# Patient Record
Sex: Female | Born: 1965 | Race: White | Hispanic: No | Marital: Married | Smoking: Never smoker
Health system: Southern US, Community
[De-identification: ages and names within clinical notes are randomized; demographics above are authoritative.]

## PROBLEM LIST (undated history)

## (undated) DIAGNOSIS — E7800 Pure hypercholesterolemia, unspecified: Secondary | ICD-10-CM

## (undated) DIAGNOSIS — M7989 Other specified soft tissue disorders: Secondary | ICD-10-CM

## (undated) DIAGNOSIS — I10 Essential (primary) hypertension: Secondary | ICD-10-CM

## (undated) DIAGNOSIS — R0602 Shortness of breath: Secondary | ICD-10-CM

## (undated) DIAGNOSIS — IMO0002 Reserved for concepts with insufficient information to code with codable children: Secondary | ICD-10-CM

## (undated) DIAGNOSIS — O1490 Unspecified pre-eclampsia, unspecified trimester: Secondary | ICD-10-CM

## (undated) DIAGNOSIS — B951 Streptococcus, group B, as the cause of diseases classified elsewhere: Secondary | ICD-10-CM

## (undated) DIAGNOSIS — K08409 Partial loss of teeth, unspecified cause, unspecified class: Secondary | ICD-10-CM

## (undated) DIAGNOSIS — R7303 Prediabetes: Secondary | ICD-10-CM

## (undated) DIAGNOSIS — O321XX0 Maternal care for breech presentation, not applicable or unspecified: Secondary | ICD-10-CM

## (undated) DIAGNOSIS — G4730 Sleep apnea, unspecified: Secondary | ICD-10-CM

## (undated) DIAGNOSIS — N946 Dysmenorrhea, unspecified: Secondary | ICD-10-CM

## (undated) DIAGNOSIS — O98819 Other maternal infectious and parasitic diseases complicating pregnancy, unspecified trimester: Secondary | ICD-10-CM

## (undated) DIAGNOSIS — D219 Benign neoplasm of connective and other soft tissue, unspecified: Secondary | ICD-10-CM

## (undated) DIAGNOSIS — F32A Depression, unspecified: Secondary | ICD-10-CM

## (undated) DIAGNOSIS — R87619 Unspecified abnormal cytological findings in specimens from cervix uteri: Secondary | ICD-10-CM

## (undated) DIAGNOSIS — B019 Varicella without complication: Secondary | ICD-10-CM

## (undated) DIAGNOSIS — F329 Major depressive disorder, single episode, unspecified: Secondary | ICD-10-CM

## (undated) DIAGNOSIS — Z87442 Personal history of urinary calculi: Secondary | ICD-10-CM

## (undated) HISTORY — DX: Depression, unspecified: F32.A

## (undated) HISTORY — PX: BREAST ENHANCEMENT SURGERY: SHX7

## (undated) HISTORY — DX: Other maternal infectious and parasitic diseases complicating pregnancy, unspecified trimester: O98.819

## (undated) HISTORY — DX: Major depressive disorder, single episode, unspecified: F32.9

## (undated) HISTORY — DX: Other specified soft tissue disorders: M79.89

## (undated) HISTORY — DX: Prediabetes: R73.03

## (undated) HISTORY — PX: WISDOM TOOTH EXTRACTION: SHX21

## (undated) HISTORY — DX: Essential (primary) hypertension: I10

## (undated) HISTORY — DX: Streptococcus, group b, as the cause of diseases classified elsewhere: B95.1

## (undated) HISTORY — PX: HERNIA REPAIR: SHX51

## (undated) HISTORY — DX: Shortness of breath: R06.02

## (undated) HISTORY — DX: Partial loss of teeth, unspecified cause, unspecified class: K08.409

## (undated) HISTORY — DX: Benign neoplasm of connective and other soft tissue, unspecified: D21.9

## (undated) HISTORY — DX: Unspecified abnormal cytological findings in specimens from cervix uteri: R87.619

## (undated) HISTORY — DX: Dysmenorrhea, unspecified: N94.6

## (undated) HISTORY — DX: Sleep apnea, unspecified: G47.30

## (undated) HISTORY — PX: ABDOMINOPLASTY: SUR9

## (undated) HISTORY — PX: AUGMENTATION MAMMAPLASTY: SUR837

## (undated) HISTORY — DX: Varicella without complication: B01.9

## (undated) HISTORY — PX: OTHER SURGICAL HISTORY: SHX169

## (undated) HISTORY — PX: BREAST SURGERY: SHX581

## (undated) HISTORY — DX: Maternal care for breech presentation, not applicable or unspecified: O32.1XX0

## (undated) HISTORY — DX: Reserved for concepts with insufficient information to code with codable children: IMO0002

## (undated) HISTORY — DX: Unspecified pre-eclampsia, unspecified trimester: O14.90

## (undated) HISTORY — DX: Pure hypercholesterolemia, unspecified: E78.00

---

## 1997-10-17 ENCOUNTER — Inpatient Hospital Stay (HOSPITAL_COMMUNITY): Admission: AD | Admit: 1997-10-17 | Discharge: 1997-10-17 | Payer: Self-pay | Admitting: Obstetrics and Gynecology

## 1997-11-21 ENCOUNTER — Inpatient Hospital Stay (HOSPITAL_COMMUNITY): Admission: AD | Admit: 1997-11-21 | Discharge: 1997-11-26 | Payer: Self-pay | Admitting: Obstetrics and Gynecology

## 1997-11-26 ENCOUNTER — Encounter (HOSPITAL_COMMUNITY): Admission: RE | Admit: 1997-11-26 | Discharge: 1997-12-30 | Payer: Self-pay | Admitting: Obstetrics and Gynecology

## 1997-11-27 ENCOUNTER — Ambulatory Visit (HOSPITAL_COMMUNITY): Admission: RE | Admit: 1997-11-27 | Discharge: 1997-11-27 | Payer: Self-pay | Admitting: Obstetrics and Gynecology

## 1997-12-18 ENCOUNTER — Other Ambulatory Visit: Admission: RE | Admit: 1997-12-18 | Discharge: 1997-12-18 | Payer: Self-pay | Admitting: Obstetrics and Gynecology

## 1998-03-06 ENCOUNTER — Other Ambulatory Visit: Admission: RE | Admit: 1998-03-06 | Discharge: 1998-03-06 | Payer: Self-pay | Admitting: Obstetrics and Gynecology

## 1998-08-26 ENCOUNTER — Other Ambulatory Visit: Admission: RE | Admit: 1998-08-26 | Discharge: 1998-08-26 | Payer: Self-pay | Admitting: Obstetrics and Gynecology

## 1999-10-06 ENCOUNTER — Other Ambulatory Visit: Admission: RE | Admit: 1999-10-06 | Discharge: 1999-10-06 | Payer: Self-pay | Admitting: Obstetrics and Gynecology

## 2000-12-21 ENCOUNTER — Other Ambulatory Visit: Admission: RE | Admit: 2000-12-21 | Discharge: 2000-12-21 | Payer: Self-pay | Admitting: Obstetrics and Gynecology

## 2002-02-14 ENCOUNTER — Inpatient Hospital Stay (HOSPITAL_COMMUNITY): Admission: AD | Admit: 2002-02-14 | Discharge: 2002-02-14 | Payer: Self-pay | Admitting: Obstetrics and Gynecology

## 2002-02-21 ENCOUNTER — Inpatient Hospital Stay (HOSPITAL_COMMUNITY): Admission: AD | Admit: 2002-02-21 | Discharge: 2002-02-24 | Payer: Self-pay | Admitting: Obstetrics and Gynecology

## 2002-04-04 ENCOUNTER — Other Ambulatory Visit: Admission: RE | Admit: 2002-04-04 | Discharge: 2002-04-04 | Payer: Self-pay | Admitting: *Deleted

## 2003-08-12 ENCOUNTER — Other Ambulatory Visit: Admission: RE | Admit: 2003-08-12 | Discharge: 2003-08-12 | Payer: Self-pay | Admitting: Family Medicine

## 2004-08-13 ENCOUNTER — Other Ambulatory Visit: Admission: RE | Admit: 2004-08-13 | Discharge: 2004-08-13 | Payer: Self-pay | Admitting: Family Medicine

## 2005-03-10 ENCOUNTER — Encounter: Admission: RE | Admit: 2005-03-10 | Discharge: 2005-03-10 | Payer: Self-pay | Admitting: Family Medicine

## 2006-03-29 ENCOUNTER — Encounter: Admission: RE | Admit: 2006-03-29 | Discharge: 2006-03-29 | Payer: Self-pay | Admitting: Family Medicine

## 2006-05-04 ENCOUNTER — Encounter: Admission: RE | Admit: 2006-05-04 | Discharge: 2006-05-04 | Payer: Self-pay | Admitting: Family Medicine

## 2007-03-28 ENCOUNTER — Encounter: Admission: RE | Admit: 2007-03-28 | Discharge: 2007-03-28 | Payer: Self-pay | Admitting: General Surgery

## 2008-01-23 ENCOUNTER — Encounter: Admission: RE | Admit: 2008-01-23 | Discharge: 2008-01-23 | Payer: Self-pay | Admitting: Family Medicine

## 2008-05-16 ENCOUNTER — Encounter: Admission: RE | Admit: 2008-05-16 | Discharge: 2008-05-16 | Payer: Self-pay | Admitting: Family Medicine

## 2009-03-02 ENCOUNTER — Encounter: Admission: RE | Admit: 2009-03-02 | Discharge: 2009-03-02 | Payer: Self-pay | Admitting: Family Medicine

## 2010-03-28 ENCOUNTER — Emergency Department (HOSPITAL_BASED_OUTPATIENT_CLINIC_OR_DEPARTMENT_OTHER)
Admission: EM | Admit: 2010-03-28 | Discharge: 2010-03-28 | Payer: Self-pay | Source: Home / Self Care | Admitting: Emergency Medicine

## 2010-04-05 LAB — BASIC METABOLIC PANEL
BUN: 17 mg/dL (ref 6–23)
CO2: 22 mEq/L (ref 19–32)
Calcium: 8.8 mg/dL (ref 8.4–10.5)
Chloride: 103 mEq/L (ref 96–112)
Creatinine, Ser: 0.7 mg/dL (ref 0.4–1.2)
GFR calc Af Amer: >60 mL/min (ref >60)
GFR calc non Af Amer: >60 mL/min (ref >60)
Glucose, Bld: 110 mg/dL — ABNORMAL HIGH (ref 70–99)
Potassium: 3.4 mEq/L — ABNORMAL LOW (ref 3.5–5.1)
Sodium: 140 mEq/L (ref 135–145)

## 2010-04-05 LAB — CBC
HCT: 40 % (ref 36.0–46.0)
Hemoglobin: 14.1 g/dL (ref 12.0–15.0)
MCH: 30.1 pg (ref 26.0–34.0)
MCHC: 35.3 g/dL (ref 30.0–36.0)
MCV: 85.3 fL (ref 78.0–100.0)
Platelets: 237 10*3/uL (ref 150–400)
RBC: 4.69 MIL/uL (ref 3.87–5.11)
RDW: 13.4 % (ref 11.5–15.5)
WBC: 9.1 10*3/uL (ref 4.0–10.5)

## 2010-04-05 LAB — URINALYSIS, ROUTINE W REFLEX MICROSCOPIC
Hgb urine dipstick: NEGATIVE
Ketones, ur: NEGATIVE mg/dL
Leukocytes, UA: NEGATIVE
Nitrite: NEGATIVE
Protein, ur: 30 mg/dL — AB
Specific Gravity, Urine: 1.03 (ref 1.005–1.030)
Urine Glucose, Fasting: NEGATIVE mg/dL
Urobilinogen, UA: 0.2 mg/dL (ref 0.0–1.0)
pH: 6 (ref 5.0–8.0)

## 2010-04-05 LAB — URINE MICROSCOPIC-ADD ON

## 2010-04-05 LAB — URINE CULTURE
Colony Count: NO GROWTH
Culture  Setup Time: 201201082210
Culture: NO GROWTH

## 2010-04-11 ENCOUNTER — Encounter: Payer: Self-pay | Admitting: Family Medicine

## 2010-04-22 ENCOUNTER — Other Ambulatory Visit: Payer: Self-pay | Admitting: Family Medicine

## 2010-04-22 DIAGNOSIS — Z1239 Encounter for other screening for malignant neoplasm of breast: Secondary | ICD-10-CM

## 2010-05-04 ENCOUNTER — Ambulatory Visit: Payer: Self-pay

## 2010-05-06 ENCOUNTER — Ambulatory Visit: Payer: Self-pay

## 2010-05-20 ENCOUNTER — Ambulatory Visit
Admission: RE | Admit: 2010-05-20 | Discharge: 2010-05-20 | Disposition: A | Discharge status: Home / Self Care | Payer: Commercial Indemnity | Source: Ambulatory Visit | Attending: Family Medicine | Admitting: Family Medicine

## 2010-05-20 DIAGNOSIS — Z1239 Encounter for other screening for malignant neoplasm of breast: Secondary | ICD-10-CM

## 2010-06-16 ENCOUNTER — Other Ambulatory Visit (HOSPITAL_COMMUNITY)
Admission: RE | Admit: 2010-06-16 | Discharge: 2010-06-16 | Disposition: A | Discharge status: Home / Self Care | Payer: Commercial Indemnity | Source: Ambulatory Visit | Attending: Family Medicine | Admitting: Family Medicine

## 2010-06-16 ENCOUNTER — Other Ambulatory Visit: Payer: Self-pay | Admitting: Family Medicine

## 2010-06-16 DIAGNOSIS — Z124 Encounter for screening for malignant neoplasm of cervix: Secondary | ICD-10-CM | POA: Insufficient documentation

## 2010-06-16 DIAGNOSIS — Z1159 Encounter for screening for other viral diseases: Secondary | ICD-10-CM | POA: Insufficient documentation

## 2010-08-06 NOTE — H&P (Signed)
NAME:  Judy Weaver, ADMIRE NO.:  [id number]   MEDICAL RECORD NO.:  [id number]                   PATIENT TYPE:  INP   LOCATION:  NA                                   FACILITY[facility]  [facility]   PHYSICIAN:  Janine Limbo, M.D.            DATE OF BIRTH:  08/23/1965   DATE OF ADMISSION:  02/21/2002  DATE OF DISCHARGE:                                HISTORY & PHYSICAL   HISTORY OF PRESENT ILLNESS:  The patient is a 45 year old female gravida 3,  para 1-0-1-1 who presents at [redacted] weeks gestation ([facility] March 03, 2002) for  repeat cesarean section.  The patient has been followed at [facility] and Gynecology for this pregnancy that has been largely  uncomplicated.  She does have a prior history of a cesarean section because  of a breech infant.  She also has a prior history of a large for gestational  age infant and a history of preeclampsia.  She had beta Strep during her  last pregnancy.  She also had some preterm labor symptoms with her last  pregnancy, but did deliver at term.  That infant weighed 9 pounds and 7  ounces at 38 weeks.   OBSTETRICAL HISTORY:  The patient had a cesarean section in 1999 where she  delivered a 9 pound 7 ounce female infant at term (breech presentation).  That  pregnancy was complicated by preeclampsia.  In 1987 she had an elective  pregnancy termination.   ALLERGIES:  SULFA MEDICATIONS cause nausea and vomiting.   PAST MEDICAL HISTORY:  The patient denies hypertension and diabetes other  than as mentioned above.  She did have her wisdom teeth removed at age 59.  She had a tonsillectomy at age 35.   REVIEW OF SYSTEMS:  Normal pregnancy complaints.   SOCIAL HISTORY:  The patient denies cigarette use, alcohol use, and  recreational drug use.   FAMILY HISTORY:  The patient's father has chronic hypertension.   PHYSICAL EXAMINATION:  VITAL SIGNS:  Weight 181 pounds.  HEENT:  Within normal limits.  CHEST:  Clear.  HEART:  Regular rate and rhythm.  BREASTS:  Without masses.  ABDOMEN:  Gravid with a fundal height of 37 cm.  EXTREMITIES:  Within normal limits.  NEUROLOGIC:  Grossly normal.  PELVIC:  Cervix is closed and long.   LABORATORY VALUES:  Blood type is O+.  Antibody screen negative.  Toxoplasmosis screen negative.  VDRL nonreactive.  Rubella immune.  HBSAG  negative.  HIV nonreactive.  GC negative.  Chlamydia negative.  Alpha  fetoprotein screen within normal limits.  Three hour GTT was within normal  limits.   ASSESSMENT:  1. A [redacted] week gestation.  2. Prior cesarean section and desires repeat cesarean section.  3. History of postpartum depression.   PLAN:  The patient will undergo a repeat low transverse cesarean section.  She does not want her tubes  tied at this time.  We will continue the patient  on Zoloft 50 mg q.d.                                               Janine Limbo, M.D.    AVS/MEDQ  D:  01/31/2002  T:  01/31/2002  Job:  [id number]

## 2010-08-06 NOTE — Op Note (Signed)
NAME:  Judy Weaver, Judy Weaver NO.:  [id number]   MEDICAL RECORD NO.:  [id number]                   PATIENT TYPE:  INP   LOCATION:  9199                                 [facility]ILITY[facility]  [facility]   PHYSICIAN:  Janine Limbo, M.D.            DATE OF BIRTH:  01-15-1966   DATE OF PROCEDURE:  02/21/2002  DATE OF DISCHARGE:                                 OPERATIVE REPORT   PREOPERATIVE DIAGNOSES:  1. Term intrauterine pregnancy.  2. Prior cesarean section.  3. Desires repeat cesarean section.   POSTOPERATIVE DIAGNOSES:  1. Term intrauterine pregnancy.  2. Prior cesarean section.  3. Desires repeat cesarean section.   PROCEDURE:  Repeat low transverse cesarean section.   SURGEON:  Janine Limbo, M.D.   FIRST ASSISTANT:  Renaldo Reel. Emilee Hero, C.N.M.   ANESTHESIA:  Spinal.   DISPOSITION:  The patient is a 45 year old female gravida 3, para 1-0-1-1  who presents at [redacted] weeks gestation for repeat cesarean section.  She  understands the indications for her procedure and she accepts the risks of,  but not limited to, anesthetic complications, bleeding, infections, and  possible damage to the surrounding organs.   FINDINGS:  A 9 pound female infant English as a second language teacher) was delivered from a cephalic  presentation.  The Apgars were 8 at one minute and 9 at five minutes.  The  uterus, fallopian tubes, and ovaries were normal for the gravid state.   PROCEDURE:  The patient was taken to the operating room where a spinal  anesthetic was given.  The patient's abdomen, perineum, and outer vagina  were prepped with multiple layers of Betadine.  A Foley catheter was placed  in the bladder.  The patient was sterilely draped.  A low transverse  incision was made in the abdomen and carried sharply through the  subcutaneous tissue, the fascia, and the anterior peritoneum.  An incision  was made in the lower uterine segment and the bladder flap was developed.  The fetal head was delivered  without difficulty.  The mouth and nose were  suctioned.  The remainder of the infant was delivered.  A nuchal cord was  reduced.  The cord was clamped and cut and the infant was handed to the  waiting pediatric team.  Routine cord blood studies were obtained.  The  placenta was removed.  The uterine cavity was cleaned of amniotic fluid,  clotted blood, and membranes.  The uterine incision was closed using a  running locking suture of 2-0 Vicryl followed by an embrocating suture of 2-  0 Vicryl.  Figure-of-eight sutures of 2-0 Vicryl and 0 Vicryl were used for  hemostasis.  The pelvis was irrigated.  Hemostasis was adequate.  The  anterior peritoneum and the abdominal musculature were reapproximated in the  midline using 0 Vicryl.  The fascia was closed using a running suture of 0  Vicryl followed by three interrupted sutures of  0 Vicryl.  A subcutaneous  layer was closed using 0 Vicryl.  The skin was reapproximated using a  subcuticular suture of 3-0 Monocryl.  Sponge, needle, and instrument counts  were correct on two occasions.  The estimated blood loss for the procedure  was 700 cc.  The patient tolerated her procedure well.  She was taken to the  recovery room in stable condition.  The infant was taken to the full-term  nursery in stable condition.                                               Janine Limbo, M.D.    AVS/MEDQ  D:  02/21/2002  T:  02/21/2002  Job:  [id number]

## 2010-08-06 NOTE — H&P (Signed)
NAME:  Judy Weaver, Judy Weaver NO.:  [id number]   MEDICAL RECORD NO.:  [id number]                   PATIENT TYPE:  INP   LOCATION:  NA                                   [facility]   PHYSICIAN:  Janine Limbo, M.D.            DATE OF BIRTH:  [birth year]   DATE OF ADMISSION:  02/21/2002  DATE OF DISCHARGE:                                HISTORY & PHYSICAL   HISTORY OF PRESENT ILLNESS:  The patient is a 45 year old female, gravida 3,  para 1-0-1-1, who presents at [redacted] weeks gestation ([facility] is 03/03/02) for  repeat cesarean section.  She has been followed at [facility] and Gynecology for this pregnancy that has been complicated by  the fact that she had a prior cesarean section.  In addition, she has a  history of a prior positive beta strep culture.  She has had pre-term labor  in the past, but then a term delivery.  She also had preeclampsia with her  prior pregnancy, and she had a large for gestational age infant (weight was  9 pounds 7 ounces).  She currently is taking Zoloft because of her history  of postpartum depression, and she feels much better on this medication.   PAST OBSTETRICAL HISTORY:  1. In 1987, the patient had a first trimester elective pregnancy     termination.  2. In 1999, she had a cesarean section at term where she delivered a 9 pound     7 ounce female infant.  That infant was in a breech position, but she also     had pregnancy induced hypertension.   PAST MEDICAL HISTORY:  Please see history of present illness.   ALLERGIES:  SULFA MEDICATIONS cause vomiting.   SOCIAL HISTORY:  The patient denies cigarette use, alcohol use, and  recreational drug use.   REVIEW OF SYMPTOMS:  Normal pregnancy complaints.   FAMILY HISTORY:  Noncontributory.   PHYSICAL EXAMINATION:  VITAL SIGNS:  Weight is 183 pounds.  HEENT:  Within normal limits.  CHEST:  Clear.  HEART:  Regular rate and rhythm.  BREASTS:  Without  masses.  ABDOMEN:  Gravid with a fundal height of 39 cm.  EXTREMITIES:  Within normal limits.  NEUROLOGIC:  Grossly normal.   LABORATORY DATA:  Blood type is O positive, antibody screen negative, VDRL  nonreactive.  Toxoplasmosis negative.  Rubella immune.  HBSAG negative.  HIV  nonreactive.  Pap within normal limits.  GC negative.  Chlamydia negative.  Cystic fibrosis negative.  Alpha fetoprotein within normal limits.  Third  trimester beta strep negative.  Three hour GTT within normal limits.  Third  trimester GC and Chlamydia were both negative.   ASSESSMENT:  1. Gestation at 38 weeks.  2. Prior cesarean section.  3. Desires repeat cesarean section.  4. History of postpartum depression.   PLAN:  The patient will undergo a repeat  low transverse cesarean section.  She understands the indications for her procedure, and she accepts the risks  of, but not limited to, anesthetic complications, bleeding, infection, and  possible damage to the surrounding organs.  She does not desire tubal  ligation.                                                Janine Limbo, M.D.    AVS/MEDQ  D:  02/17/2002  T:  02/17/2002  Job:  [id number]

## 2010-08-06 NOTE — Discharge Summary (Signed)
NAME:  Judy Weaver, Judy Weaver NO.:  [id number]   MEDICAL RECORD NO.:  [id number]                   PATIENT TYPE:  INP   LOCATION:  9136                                 [facility]   PHYSICIAN:  Crist Fat. Rivard, M.D.              DATE OF BIRTH:  Sep 13, 1965   DATE OF ADMISSION:  02/21/2002  DATE OF DISCHARGE:  02/24/2002                                 DISCHARGE SUMMARY   ADMISSION DIAGNOSES:  1. Intrauterine pregnancy at 38 weeks.  2. Previous Cesarean section, desires repeat.  3. History of postpartum depression.   DISCHARGE DIAGNOSES:  1. Intrauterine pregnancy at 38 weeks.  2. Previous Cesarean section, desires repeat.  3. History of postpartum depression.  4. Anemia.   PROCEDURE:  1. Repeat low transverse Cesarean section.  2. Spinal anesthesia.   HISTORY OF PRESENT ILLNESS:  Judy Weaver is a 45 year old Gravida III, Para  I, 0/1/1 at 38 weeks who was admitted on February 21, 2002 for repeat  Cesarean section. Her pregnancy had been remarkable for the following:   GYNECOLOGIC HISTORY:  1. History of previous Cesarean section secondary to preeclampsia and breech     presentation.  2. History of preeclampsia.  3. History of preterm labor with a term delivery.  4. History of LGA infant.  5. History of postpartum depression.   HOSPITAL COURSE:  The patient was taken to the OR where a repeat low  transverse Cesarean section was performed by Dr. Marline Backbone under  spinal anesthesia. Findings were a viable female by the name of Judy Weaver.  Weight was 9 pounds. Apgar's were 8 and 9. The infant was taken to the full  term nursery. Mother tolerated the procedure well. She was taken to the  recovery room  in good condition. Social work consult was accomplished for  history of postpartum depression but the patient reported that she had good  support services and she was already on Zoloft prophylactically. By  postpartum day one, she was doing well.  She was up ad lib. Her dressing was  clean, dry, and intact. She was bottle feeding. She is undecided regarding  contraception. She was having some gastric distention but did have bowel  sounds. Her hemoglobin was 8.9, down from 8.8. WBC count 7.9. Platelets were  198. She declined transfusion. She was tolerating ambulation without  difficulty. She also was on Aldomet for hypertension during the third  trimester of this pregnancy. This was discontinued on postoperative day two  secondary to normalized blood pressures. The rest of her hospital course was  uncomplicated. By day three, she was up ad lib. Her incision was clean, dry,  and intact with subcutaneous sutures noted. The patient was considering oral  contraceptives but had not fully decided. Her blood pressure was within  normal limits.   DISPOSITION:  The patient was deemed to have received full benefit from her  hospital  stay and was discharged to home.   DISCHARGE INSTRUCTIONS:  She was presented with [facility]out.   MEDICATIONS:  1. Motrin 600 mg by mouth every six hours as needed pain.  2. Tylox 1-2 by mouth every three to four hour as needed pain.   FOLLOW UP:  In two weeks for blood pressure check and another discussion  regarding contraception since the patient may be interested in starting  birth control pills, prior to her six week visit. Follow-up will also occur  at six weeks postpartum.   DICTATED BY:  Renaldo Reel. Emilee Hero, C.N.M.                                                Crist Fat Rivard, M.D.    SAR/MEDQ  D:  02/24/2002  T:  02/25/2002  Job:  [id number]   cc:   Dois Davenport A. Rivard, M.D.  [address], [address]  [place]  [place] [address]  Fax: [phone]

## 2011-05-09 ENCOUNTER — Other Ambulatory Visit: Payer: Self-pay | Admitting: Family Medicine

## 2011-05-09 DIAGNOSIS — Z1231 Encounter for screening mammogram for malignant neoplasm of breast: Secondary | ICD-10-CM

## 2011-06-08 ENCOUNTER — Ambulatory Visit: Payer: Commercial Indemnity

## 2011-06-15 ENCOUNTER — Ambulatory Visit
Admission: RE | Admit: 2011-06-15 | Discharge: 2011-06-15 | Disposition: A | Discharge status: Home / Self Care | Payer: 59 | Source: Ambulatory Visit | Attending: Family Medicine | Admitting: Family Medicine

## 2011-06-15 DIAGNOSIS — Z1231 Encounter for screening mammogram for malignant neoplasm of breast: Secondary | ICD-10-CM

## 2011-06-20 ENCOUNTER — Other Ambulatory Visit: Payer: Self-pay | Admitting: Family Medicine

## 2011-06-20 DIAGNOSIS — R928 Other abnormal and inconclusive findings on diagnostic imaging of breast: Secondary | ICD-10-CM

## 2011-06-23 ENCOUNTER — Ambulatory Visit
Admission: RE | Admit: 2011-06-23 | Discharge: 2011-06-23 | Disposition: A | Discharge status: Home / Self Care | Payer: 59 | Source: Ambulatory Visit | Attending: Family Medicine | Admitting: Family Medicine

## 2011-06-23 DIAGNOSIS — R928 Other abnormal and inconclusive findings on diagnostic imaging of breast: Secondary | ICD-10-CM

## 2011-07-27 ENCOUNTER — Telehealth: Payer: Self-pay | Admitting: Obstetrics and Gynecology

## 2011-08-05 DIAGNOSIS — D219 Benign neoplasm of connective and other soft tissue, unspecified: Secondary | ICD-10-CM

## 2011-08-09 ENCOUNTER — Ambulatory Visit (INDEPENDENT_AMBULATORY_CARE_PROVIDER_SITE_OTHER): Payer: 59 | Admitting: Obstetrics and Gynecology

## 2011-08-09 ENCOUNTER — Encounter: Payer: Self-pay | Admitting: Obstetrics and Gynecology

## 2011-08-09 VITALS — BP 102/70 | Resp 16 | Ht 64.0 in | Wt 164.0 lb

## 2011-08-09 DIAGNOSIS — Z30430 Encounter for insertion of intrauterine contraceptive device: Secondary | ICD-10-CM

## 2011-08-09 DIAGNOSIS — IMO0001 Reserved for inherently not codable concepts without codable children: Secondary | ICD-10-CM

## 2011-08-09 DIAGNOSIS — Z309 Encounter for contraceptive management, unspecified: Secondary | ICD-10-CM

## 2011-08-09 LAB — POCT URINE PREGNANCY: Preg Test, Ur: NEGATIVE

## 2011-08-09 MED ORDER — LEVONORGESTREL 20 MCG/24HR IU IUD: INTRAUTERINE_SYSTEM | Freq: Once | INTRAUTERINE | Status: DC

## 2011-08-09 NOTE — Progress Notes (Signed)
Patient ID: Judy Weaver, female   DOB: [birth year], 46 y.o.   MRN: [id number] IUD: Mirena LOT#: [id number] Exp: 06/15 UPT: negative GC/CHLAMYDIA: sent CONSENT SIGNED: yes DISINFECTION WITH betadine X3 UTERUS SOUNDED AT 7 CM IUD INSERTED PER PROTOCOL: yes COMPLICATION: none PATIENT INSTRUCTED TO CALL IS FEVER OR ABNORMAL PAIN:no PATIENT INSTRUCTED ON HOW TO CHECK IUD STRINGS: yes FOLLOW UP APPT: 6 weeks  The patient is a 46 year old who presents with a Mirena IUD that she has had for 5 years.  She does not plan to have more children.  We discussed her contraceptive options including vasectomy.  The patient reports that she does not have a regular cycle and she likes this.  She wants to maintain intrauterine contraception.  The patient reports that she sees her family doctor for her routine care.  She is up-to-date with her Pap smears.  She recently had a mammogram followed by an ultrasound.  No pathology was appreciated.  Abdomen is soft and nontender  Pelvic exam:  External genitalia: Normal  Vagina: Relaxation noted  Cervix: Nontender, no lesions, IUD string visualized  Uterus: Normal size shape and consistency  Adnexa: No masses  Pregnancy test: Negative  Procedure:  The vagina and cervix were prepped with Betadine.  IUD was removed without difficulty.  A new Mirena IUD was placed per protocol.  See above.  Plan:  Instructions given.  The patient will return 6 weeks for followup examination.  She will call for questions or concerns. GC and Chlamydia sent.  Dr. Stefano Gaul

## 2011-08-09 NOTE — Patient Instructions (Signed)
Intrauterine Device Insertion Most often, an intrauterine device (IUD) is inserted into the uterus to prevent pregnancy. There are 2 types of IUDs available:  Copper IUD. This type of IUD creates an environment that is not favorable to sperm survival. The mechanism of action of the copper IUD is not known for certain. It can stay in place for 10 years.   Hormone IUD. This type of IUD contains the hormone progestin (synthetic progesterone). The progestin thickens the cervical mucus and prevents sperm from entering the uterus, and it also thins the uterine lining. There is no evidence that the hormone IUD prevents implantation. The hormone IUD can stay in place for up to 5 years.  An IUD is the most cost-effective birth control if left in place for the full duration. It may be removed at any time. LET YOUR CAREGIVER KNOW ABOUT:  Sensitivity to metals.   Medicines taken including herbs, eyedrops, over-the-counter medicines, and creams.   Use of steroids (by mouth or creams).   Previous problems with anesthetics or numbing medicine.   Previous gynecological surgery.   History of blood clots or clotting disorders.   Possibility of pregnancy.   Menstrual irregularities.   Concerns regarding unusual vaginal discharge or odors.   Previous experience with an IUD.   Other health problems.  RISKS AND COMPLICATIONS  Accidental puncture (perforation) of the uterus.   Accidental placement of the IUD either in the muscle layer of the uterus (myometrium) or outside the uterus. If this happen, the IUD can be found essentially floating around the bowels. When this happens, the IUD must be taken out surgically.   The IUD may fall out of the uterus (expulsion). This is more common in women who have recently had a child.    Pregnancy in the fallopian tube (ectopic).  BEFORE THE PROCEDURE  Schedule the IUD insertion for when you will have your menstrual period or right after, to make sure you  are not pregnant. Placement of the IUD is better tolerated shortly after a menstrual cycle.   You may need to take tests or be examined to make sure you are not pregnant.   You may be required to take a pregnancy test.   You may be required to get checked for sexually transmitted infections (STIs) prior to placement. Placing an IUD in someone who has an infection can make an infection worse.   You may be given a pain reliever to take 1 or 2 hours before the procedure.   An exam will be performed to determine the size and position of your uterus.   Ask your caregiver about changing or stopping your regular medicines.  PROCEDURE   A tool (speculum) is placed in the vagina. This allows your caregiver to see the lower part of the uterus (cervix).   The cervix is prepped with a medicine that lowers the risk of infection.   You may be given a medicine to numb each side of the cervix (intracervical or paracervical block). This is used to block and control any discomfort with insertion.   A tool (uterine sound) is inserted into the uterus to determine the length of the uterine cavity and the direction the uterus may be tilted.   A slim instrument (IUD inserter) is inserted through the cervical canal and into your uterus.   The IUD is placed in the uterine cavity and the insertion device is removed.   The nylon string that is attached to the IUD, and used for   eventual IUD removal, is trimmed. It is trimmed so that it lays high in the vagina, just outside the cervix.  AFTER THE PROCEDURE  You may have bleeding after the procedure. This is normal. It varies from light spotting for a few days to menstrual-like bleeding.   You may have mild cramping.   Practice checking the string coming out of the cervix to make sure the IUD remains in the uterus. If you cannot feel the string, you should schedule a "string check" with your caregiver.   If you had a hormone IUD inserted, expect that your  period may be lighter or nonexistent within a year's time (though this is not always the case). There may be delayed fertility with the hormone IUD as a result of its progesterone effect. When you are ready to become pregnant, it is suggested to have the IUD removed up to 1 year in advance.   Yearly exams are advised.  Document Released: 11/03/2010 Document Revised: 02/24/2011 Document Reviewed: 11/03/2010 [employer] Patient Information 2012 [employer]. 

## 2011-08-10 LAB — GC/CHLAMYDIA PROBE AMP, GENITAL
Chlamydia, DNA Probe: NEGATIVE
GC Probe Amp, Genital: NEGATIVE

## 2011-09-19 ENCOUNTER — Encounter: Payer: 59 | Admitting: Obstetrics and Gynecology

## 2011-09-29 ENCOUNTER — Ambulatory Visit (INDEPENDENT_AMBULATORY_CARE_PROVIDER_SITE_OTHER): Payer: 59 | Admitting: Obstetrics and Gynecology

## 2011-09-29 ENCOUNTER — Encounter: Payer: Self-pay | Admitting: Obstetrics and Gynecology

## 2011-09-29 VITALS — BP 90/62 | Ht 62.4 in | Wt 166.0 lb

## 2011-09-29 DIAGNOSIS — E663 Overweight: Secondary | ICD-10-CM | POA: Insufficient documentation

## 2011-09-29 DIAGNOSIS — N951 Menopausal and female climacteric states: Secondary | ICD-10-CM | POA: Insufficient documentation

## 2011-09-29 DIAGNOSIS — Z309 Encounter for contraceptive management, unspecified: Secondary | ICD-10-CM

## 2011-09-29 NOTE — Progress Notes (Signed)
HISTORY OF PRESENT ILLNESS  [name] is a 46 y.o. year old female,G3P2, who presents for a problem visit. The patient had a Mirena IUD placed on Aug 09, 2011.  GC negative. Chlamydia negative.  Subjective:  The patient reports that she is doing well with her Mirena.  There are no complaints with the length of the string.  The patient complains of night sweats, decreased libido, increase emotions, and vaginal dryness.  Objective:  BP 90/62  Ht 5' 2.4" (1.585 m)  Wt 166 lb (75.297 kg)  BMI 29.97 kg/m2   General: alert and no distress Resp: clear to auscultation bilaterally Cardio: regular rate and rhythm, S1, S2 normal, no murmur, click, rub or gallop GI: soft, non-tender; bowel sounds normal; no masses,  no organomegaly  External genitalia: normal general appearance Vaginal: normal without tenderness, induration or masses and relaxation noted Cervix: normal appearance and IUD string visualized Adnexa: normal bimanual exam Uterus: upper limits normal size shape and consistency  Assessment:  Menopausal symptoms Normal functioning Mirena IUD  Plan:  Menopausal symptoms were discussed. The women's health initiative was discussed. Treatment options reviewed.  The patient was given a handout about different herbal medications.  She will call or return if her symptoms increased to the point she feels that additional medication is required.  Return to office prn if symptoms worsen or fail to improve. This time for the patient to schedule her GYN annual exam.   Leonard Schwartz M.D.  09/29/2011 11:35 AM

## 2011-10-13 ENCOUNTER — Telehealth: Payer: Self-pay | Admitting: Obstetrics and Gynecology

## 2011-10-13 NOTE — Telephone Encounter (Signed)
TC from pt.   States had irreg spotting before previous Mirena was removed.  Rceived new Mirena 07/2011.  Has been spotting daily since 09/29/11 enough to wear panty liner. Per DR AVS may be readjustment to new Mirena.  May observe or make appt to discuss possible use of Estrogen.  Pt opts to observe and call if continues or increases.

## 2011-10-13 NOTE — Telephone Encounter (Signed)
-----   Message from Jerilynn Mages sent at 10/13/2011 1:57 PM ----- Pt calling has questions about spotting and her IUD.

## 2011-10-13 NOTE — Telephone Encounter (Signed)
TC to pt. LM to return call. Regarding message. 

## 2011-11-16 ENCOUNTER — Encounter: Payer: 59 | Admitting: Obstetrics and Gynecology

## 2012-03-06 ENCOUNTER — Encounter: Payer: Self-pay | Admitting: Obstetrics and Gynecology

## 2012-03-06 ENCOUNTER — Ambulatory Visit (INDEPENDENT_AMBULATORY_CARE_PROVIDER_SITE_OTHER): Payer: Managed Care, Other (non HMO) | Admitting: Obstetrics and Gynecology

## 2012-03-06 VITALS — BP 98/66 | Ht 62.0 in | Wt 168.0 lb

## 2012-03-06 DIAGNOSIS — N951 Menopausal and female climacteric states: Secondary | ICD-10-CM

## 2012-03-06 DIAGNOSIS — N926 Irregular menstruation, unspecified: Secondary | ICD-10-CM

## 2012-03-06 MED ORDER — ESTRADIOL 0.5 MG PO TABS: 0.5000 mg | ORAL_TABLET | Freq: Every day | ORAL | Status: DC

## 2012-03-06 NOTE — Progress Notes (Signed)
HISTORY OF PRESENT ILLNESS  Judy Weaver is a 46 y.o. year old female,G3P2, who presents for a problem visit. The patient had a Mirena IUD placed in May 2013. The patient has a known history of fibroids.  Subjective:  The patient complains of continued aggravating bleeding.  She has also noticed that she has an increase in her menopausal symptoms.  Objective:  BP 98/66  Ht 5\' 2"  (1.575 m)  Wt 168 lb (76.204 kg)  BMI 30.73 kg/m2   General: no distress Resp: clear to auscultation bilaterally Cardio: regular rate and rhythm, S1, S2 normal, no murmur, click, rub or gallop GI: soft and nontender  External genitalia: normal general appearance Vaginal: normal without tenderness, induration or masses Cervix: normal appearance, IUD string visualized and menstrual blood Adnexa: normal bimanual exam Uterus: upper limits normal size  Assessment:  Irregular bleeding  Menopausal symptoms  Fibroid uterus  Plan:  Irregular bleeding associated with Mirena IUD was discussed.  She is disappointed because she has had an IUD in the past and did not have this.  Treatment options were reviewed.  Began estradiol 0.5 mg daily.  Risk and benefits discussed.  Hormone replacement therapy and menopause management reviewed.  Questions answered.  Women's health initiative outlined.  The patient was to began therapy for the moment.  Return to office in 2 month(s).   Leonard Schwartz M.D.  03/06/2012 8:16 PM    When did bleeding start: spotting every day since Mirena was put in   How  Long: since May 2013 Had breakthrough bleeding the month of November and still has it  How often changing pad/tampon: wears pantyliner  Bleeding Disorders: no Cramping: yes Contraception: yes Fibroids: no Hormone Therapy: no New Medications: no Menopausal Symptoms: yes Vag. Discharge: brown -red spotting  Abdominal Pain: no Increased Stress: yes

## 2012-05-08 ENCOUNTER — Ambulatory Visit: Payer: Managed Care, Other (non HMO) | Admitting: Obstetrics and Gynecology

## 2012-05-08 ENCOUNTER — Encounter: Payer: Self-pay | Admitting: Obstetrics and Gynecology

## 2012-05-08 VITALS — BP 98/54 | Ht 62.0 in | Wt 168.0 lb

## 2012-05-08 DIAGNOSIS — D259 Leiomyoma of uterus, unspecified: Secondary | ICD-10-CM

## 2012-05-08 DIAGNOSIS — N951 Menopausal and female climacteric states: Secondary | ICD-10-CM

## 2012-05-08 DIAGNOSIS — N926 Irregular menstruation, unspecified: Secondary | ICD-10-CM

## 2012-05-08 MED ORDER — ESTRADIOL 1 MG PO TABS: 1.0000 mg | ORAL_TABLET | Freq: Every day | ORAL | Status: DC

## 2012-05-08 NOTE — Progress Notes (Signed)
HISTORY OF PRESENT ILLNESS  [name] is a 47 y.o. year old female,G3P2, who presents for a problem visit. The patient has a known history of fibroids.  She had a Mirena IUD placed in May 2013. She was started on estradiol tablets 0.5 mg daily in December 2013.  Subjective:  The patient reports that her irregular bleeding is better.  Her menopausal symptoms are also better.  She continued to have both to some degree.  Objective:  BP 98/54  Ht 5\' 2"  (1.575 m)  Wt 168 lb (76.204 kg)  BMI 30.72 kg/m2   General: no distress GI: soft and nontender  Exam deferred.  Assessment:  Irregular bleeding  Fibroid uterus  Menopausal symptoms    Plan:  The natural history of uterine fibroids was discussed. The treatment options were reviewed including observation only, nonsteroidal anti-inflammatory agents, progestin therapy ( pills, Depo-Provera, Mirena IUD), combination hormonal therapy ( birth control pills, birth control ring, contraceptive patch), Lupron therapy, Lysteda, endometrial ablation, hysteroscopy with resection and dilatation and curettage, uterine fibroid embolization, high-frequency ultrasound ablation, myomectomy, and hysterectomy.  The risks and benefits of each of the options were discussed.  The patient's questions were answered.  Management of menopausal symptoms discussed.  Begin estradiol 1.0 mg daily.  Estrogen replacement therapy reviewed.  The patient agrees.  Return to office prn if symptoms worsen or fail to improve.   Leonard Schwartz M.D.  05/08/2012 11:02 AM

## 2012-07-12 ENCOUNTER — Other Ambulatory Visit: Payer: Self-pay

## 2012-07-12 DIAGNOSIS — Z1231 Encounter for screening mammogram for malignant neoplasm of breast: Secondary | ICD-10-CM

## 2012-08-09 ENCOUNTER — Ambulatory Visit
Admission: RE | Admit: 2012-08-09 | Discharge: 2012-08-09 | Disposition: A | Discharge status: Home / Self Care | Payer: Commercial Indemnity | Source: Ambulatory Visit

## 2012-08-09 DIAGNOSIS — Z1231 Encounter for screening mammogram for malignant neoplasm of breast: Secondary | ICD-10-CM

## 2012-08-30 ENCOUNTER — Other Ambulatory Visit: Payer: Self-pay | Admitting: Dermatology

## 2013-05-20 ENCOUNTER — Telehealth (INDEPENDENT_AMBULATORY_CARE_PROVIDER_SITE_OTHER): Payer: Self-pay | Admitting: General Surgery

## 2013-05-20 NOTE — Telephone Encounter (Signed)
Pt called to alert Dr. Zella Weaver that she is having a lap tubal ligation on 07/16/13 (Dr. Raphael Weaver, [facility].)  She had an umbilical hernia repaired by Dr. Zella Weaver in the past and was unsure how safe it was to go through the mesh used at that time.  Suggested Dr. Raphael Weaver and Dr. Zella Weaver should discuss this together prior to her surgery.  She will ask Dr. Raphael Weaver to call him.

## 2013-05-21 NOTE — Telephone Encounter (Signed)
Agree 

## 2013-07-02 ENCOUNTER — Encounter (HOSPITAL_COMMUNITY): Payer: Self-pay | Admitting: Pharmacist

## 2013-07-08 ENCOUNTER — Other Ambulatory Visit: Payer: Self-pay | Admitting: Obstetrics and Gynecology

## 2013-07-10 ENCOUNTER — Other Ambulatory Visit (HOSPITAL_COMMUNITY): Payer: Commercial Indemnity

## 2013-07-12 ENCOUNTER — Encounter (HOSPITAL_COMMUNITY)
Admission: RE | Admit: 2013-07-12 | Discharge: 2013-07-12 | Disposition: A | Discharge status: Home / Self Care | Payer: BC Managed Care – PPO | Source: Ambulatory Visit | Attending: Obstetrics and Gynecology | Admitting: Obstetrics and Gynecology

## 2013-07-12 ENCOUNTER — Encounter (HOSPITAL_COMMUNITY): Payer: Self-pay

## 2013-07-12 DIAGNOSIS — Z01812 Encounter for preprocedural laboratory examination: Secondary | ICD-10-CM | POA: Insufficient documentation

## 2013-07-12 LAB — CBC
HCT: 35.7 % — ABNORMAL LOW (ref 36.0–46.0)
Hemoglobin: 12.5 g/dL (ref 12.0–15.0)
MCH: 31.3 pg (ref 26.0–34.0)
MCHC: 35 g/dL (ref 30.0–36.0)
MCV: 89.5 fL (ref 78.0–100.0)
Platelets: 272 10*3/uL (ref 150–400)
RBC: 3.99 MIL/uL (ref 3.87–5.11)
RDW: 13.7 % (ref 11.5–15.5)
WBC: 8 10*3/uL (ref 4.0–10.5)

## 2013-07-12 LAB — BASIC METABOLIC PANEL
BUN: 15 mg/dL (ref 6–23)
CO2: 29 mEq/L (ref 19–32)
Calcium: 9 mg/dL (ref 8.4–10.5)
Chloride: 98 mEq/L (ref 96–112)
Creatinine, Ser: 0.64 mg/dL (ref 0.50–1.10)
GFR calc Af Amer: >90 mL/min (ref >90)
GFR calc non Af Amer: >90 mL/min (ref >90)
Glucose, Bld: 90 mg/dL (ref 70–99)
Potassium: 4.2 mEq/L (ref 3.7–5.3)
Sodium: 137 mEq/L (ref 137–147)

## 2013-07-12 NOTE — Patient Instructions (Addendum)
Your procedure is scheduled on: Tuesday, July 16, 2013  Enter through the [employer] [facility] at: 8:30 AM  Pick up the phone at the desk and dial [phone].  Call this number if you have problems the morning of surgery: [phone].  Remember: Do NOT eat food: AFTER MIDNIGHT MONDAY Do NOT drink clear liquids after:  AFTER MIDNIGHT MONDAY  Take these medicines the morning of surgery with a SIP OF WATER: LISINOPRIL-HYDROCHLOROTHIAZIDE  Do NOT wear jewelry (body piercing), metal hair clips/bobby pins, make-up, or nail polish. Do NOT wear lotions, powders, or perfumes.  You may wear deoderant. Do NOT shave for 48 hours prior to surgery. Do NOT bring valuables to the hospital. Contacts, dentures, or bridgework may not be worn into surgery.  Have a responsible adult drive you home and stay with you for 24 hours after your procedure

## 2013-07-15 NOTE — H&P (Signed)
Admission History and Physical Exam for a Gynecology Patient  Judy Weaver is a 48 y.o. female, G3P2, who presents for for laparoscopic sterilization by way of bilateral salpingectomies, and for hysteroscopy with resection of a submucosal polyp followed by NovaSure ablation of the endometrium. She has been followed at the [facility] of [facility]. She desires sterilization. She complains of irregular cycles they have not responded to hormonal therapy. A hydrosonogram was performed that showed an endometrial polyp. Her most recent Pap smear was within normal limits.  OB History   Grav Para Term Preterm Abortions TAB SAB Ect Mult Living   3 2              Past Medical History  Diagnosis Date  . Fibroid   . Abnormal Pap smear   . Depression     Post Partum  . Group B streptococcal infection in pregnancy   . Preterm labor   . Hypertension     during pregnancy  . Breech birth   . Chicken pox   . H/O wisdom tooth extraction   . Preeclampsia   . Dysmenorrhea     No prescriptions prior to admission    Past Surgical History  Procedure Laterality Date  . Cesarean section    . Post partum depression    . Wisdom tooth extraction    . Breast surgery    . Breast enhancement surgery    . Hernia repair      umbilical  . Abdominoplasty      Allergies  Allergen Reactions  . Sulfa Antibiotics Nausea And Vomiting    Family History: family history is not on file.  Social History:  reports that she has never smoked. She has never used smokeless tobacco. She reports that she drinks about 1.2 ounces of alcohol per week. She reports that she does not use illicit drugs.  Review of systems: See HPI.  Admission Physical Exam:    There is no weight on file to calculate BMI.  There were no vitals taken for this visit.  HEENT:                 Within normal limits Chest:                   Clear Heart:                     Regular rate and rhythm Breasts:                No masses, skin changes, bleeding, or discharge present Abdomen:             Nontender, no masses Extremities:          Grossly normal Neurologic exam: Grossly normal  Pelvic exam:  External genitalia: normal general appearance Vaginal: normal without tenderness, induration or masses Cervix: normal appearance Adnexa: normal bimanual exam Uterus: Normal size shape and consistency Rectal: no masses  Assessment:  Desires sterilization  Endometrial polyp  Irregular uterine bleeding  Plan:  The patient will have a laparoscopic sterilization procedure by way of bilateral salpingectomies , hysteroscopy with resection of a submucosal polyp, and NovaSure ablation of the endometrium. She understands the indications for her surgical procedure as well as her alternative treatment options. She accepts the risk of, but not limited to, anesthetic complications, bleeding, infections, and possible damage to the surrounding organs.   Ena Dawley, M.D. 07/15/2013

## 2013-07-16 ENCOUNTER — Encounter (HOSPITAL_COMMUNITY): Payer: BC Managed Care – PPO | Admitting: Anesthesiology

## 2013-07-16 ENCOUNTER — Encounter (HOSPITAL_COMMUNITY)
Admission: RE | Disposition: A | Discharge status: Home / Self Care | Payer: Self-pay | Source: Ambulatory Visit | Attending: Obstetrics and Gynecology

## 2013-07-16 ENCOUNTER — Ambulatory Visit (HOSPITAL_COMMUNITY)
Admission: RE | Admit: 2013-07-16 | Discharge: 2013-07-16 | Disposition: A | Discharge status: Home / Self Care | Payer: BC Managed Care – PPO | Source: Ambulatory Visit | Attending: Obstetrics and Gynecology | Admitting: Obstetrics and Gynecology

## 2013-07-16 ENCOUNTER — Encounter (HOSPITAL_COMMUNITY): Payer: Self-pay | Admitting: Anesthesiology

## 2013-07-16 ENCOUNTER — Ambulatory Visit (HOSPITAL_COMMUNITY): Payer: BC Managed Care – PPO | Admitting: Anesthesiology

## 2013-07-16 DIAGNOSIS — F3289 Other specified depressive episodes: Secondary | ICD-10-CM | POA: Insufficient documentation

## 2013-07-16 DIAGNOSIS — N946 Dysmenorrhea, unspecified: Secondary | ICD-10-CM | POA: Insufficient documentation

## 2013-07-16 DIAGNOSIS — Z302 Encounter for sterilization: Secondary | ICD-10-CM | POA: Insufficient documentation

## 2013-07-16 DIAGNOSIS — Z30432 Encounter for removal of intrauterine contraceptive device: Secondary | ICD-10-CM | POA: Insufficient documentation

## 2013-07-16 DIAGNOSIS — I10 Essential (primary) hypertension: Secondary | ICD-10-CM | POA: Insufficient documentation

## 2013-07-16 DIAGNOSIS — N949 Unspecified condition associated with female genital organs and menstrual cycle: Secondary | ICD-10-CM | POA: Insufficient documentation

## 2013-07-16 DIAGNOSIS — N925 Other specified irregular menstruation: Secondary | ICD-10-CM | POA: Insufficient documentation

## 2013-07-16 DIAGNOSIS — N938 Other specified abnormal uterine and vaginal bleeding: Secondary | ICD-10-CM | POA: Insufficient documentation

## 2013-07-16 DIAGNOSIS — N840 Polyp of corpus uteri: Secondary | ICD-10-CM | POA: Insufficient documentation

## 2013-07-16 DIAGNOSIS — F329 Major depressive disorder, single episode, unspecified: Secondary | ICD-10-CM | POA: Insufficient documentation

## 2013-07-16 HISTORY — PX: HYSTEROSCOPY: SHX211

## 2013-07-16 HISTORY — PX: LAPAROSCOPIC TUBAL LIGATION: SHX1937

## 2013-07-16 HISTORY — PX: NOVASURE ABLATION: SHX5394

## 2013-07-16 HISTORY — PX: DILATATION & CURRETTAGE/HYSTEROSCOPY WITH RESECTOCOPE: SHX5572

## 2013-07-16 LAB — PREGNANCY, URINE: Preg Test, Ur: NEGATIVE

## 2013-07-16 SURGERY — LIGATION, FALLOPIAN TUBE, LAPAROSCOPIC
Anesthesia: General | Site: Vagina

## 2013-07-16 MED ORDER — KETOROLAC TROMETHAMINE 30 MG/ML IJ SOLN
INTRAMUSCULAR | Status: AC
  Filled 2013-07-16: qty 2

## 2013-07-16 MED ORDER — MIDAZOLAM HCL 2 MG/2ML IJ SOLN
INTRAMUSCULAR | Status: DC | PRN
  Administered 2013-07-16: 2 mg via INTRAVENOUS

## 2013-07-16 MED ORDER — DEXAMETHASONE SODIUM PHOSPHATE 10 MG/ML IJ SOLN
INTRAMUSCULAR | Status: AC
  Filled 2013-07-16: qty 1

## 2013-07-16 MED ORDER — PHENYLEPHRINE 40 MCG/ML (10ML) SYRINGE FOR IV PUSH (FOR BLOOD PRESSURE SUPPORT)
PREFILLED_SYRINGE | INTRAVENOUS | Status: AC
  Filled 2013-07-16: qty 5

## 2013-07-16 MED ORDER — GLYCOPYRROLATE 0.2 MG/ML IJ SOLN
INTRAMUSCULAR | Status: AC
  Filled 2013-07-16: qty 3

## 2013-07-16 MED ORDER — GLYCOPYRROLATE 0.2 MG/ML IJ SOLN
INTRAMUSCULAR | Status: DC | PRN
  Administered 2013-07-16: 0.4 mg via INTRAVENOUS
  Administered 2013-07-16: 0.1 mg via INTRAVENOUS

## 2013-07-16 MED ORDER — SCOPOLAMINE 1 MG/3DAYS TD PT72
MEDICATED_PATCH | TRANSDERMAL | Status: DC | PRN
  Administered 2013-07-16: 1 via TRANSDERMAL

## 2013-07-16 MED ORDER — ONDANSETRON HCL 4 MG/2ML IJ SOLN
INTRAMUSCULAR | Status: DC | PRN
  Administered 2013-07-16: 4 mg via INTRAVENOUS

## 2013-07-16 MED ORDER — SCOPOLAMINE 1 MG/3DAYS TD PT72
1.0000 | MEDICATED_PATCH | TRANSDERMAL | Status: DC
  Administered 2013-07-16: 1.5 mg via TRANSDERMAL

## 2013-07-16 MED ORDER — MIDAZOLAM HCL 2 MG/2ML IJ SOLN
INTRAMUSCULAR | Status: AC
  Filled 2013-07-16: qty 2

## 2013-07-16 MED ORDER — NEOSTIGMINE METHYLSULFATE 1 MG/ML IJ SOLN
INTRAMUSCULAR | Status: DC | PRN
  Administered 2013-07-16: 2 mg via INTRAVENOUS

## 2013-07-16 MED ORDER — DEXAMETHASONE SODIUM PHOSPHATE 10 MG/ML IJ SOLN
INTRAMUSCULAR | Status: DC | PRN
  Administered 2013-07-16: 10 mg via INTRAVENOUS

## 2013-07-16 MED ORDER — OXYCODONE-ACETAMINOPHEN 5-325 MG PO TABS: 1.0000 | ORAL_TABLET | ORAL | Status: DC | PRN

## 2013-07-16 MED ORDER — PROPOFOL 10 MG/ML IV BOLUS
INTRAVENOUS | Status: DC | PRN
  Administered 2013-07-16: 50 mg via INTRAVENOUS
  Administered 2013-07-16: 200 mg via INTRAVENOUS
  Administered 2013-07-16: 100 mg via INTRAVENOUS

## 2013-07-16 MED ORDER — ROCURONIUM BROMIDE 100 MG/10ML IV SOLN
INTRAVENOUS | Status: AC
  Filled 2013-07-16: qty 1

## 2013-07-16 MED ORDER — FENTANYL CITRATE 0.05 MG/ML IJ SOLN
INTRAMUSCULAR | Status: AC
  Filled 2013-07-16: qty 5

## 2013-07-16 MED ORDER — BUPIVACAINE-EPINEPHRINE (PF) 0.5% -1:200000 IJ SOLN
INTRAMUSCULAR | Status: AC
  Filled 2013-07-16: qty 30

## 2013-07-16 MED ORDER — HYDROMORPHONE HCL PF 1 MG/ML IJ SOLN
INTRAMUSCULAR | Status: DC | PRN
  Administered 2013-07-16 (×2): 0.5 mg via INTRAVENOUS

## 2013-07-16 MED ORDER — HYDROMORPHONE HCL PF 1 MG/ML IJ SOLN
INTRAMUSCULAR | Status: AC
  Filled 2013-07-16: qty 1

## 2013-07-16 MED ORDER — METOCLOPRAMIDE HCL 5 MG/ML IJ SOLN
10.0000 mg | Freq: Once | INTRAMUSCULAR | Status: AC | PRN
  Administered 2013-07-16: 10 mg via INTRAVENOUS

## 2013-07-16 MED ORDER — NEOSTIGMINE METHYLSULFATE 1 MG/ML IJ SOLN
INTRAMUSCULAR | Status: AC
  Filled 2013-07-16: qty 1

## 2013-07-16 MED ORDER — IBUPROFEN 800 MG PO TABS: 800.0000 mg | ORAL_TABLET | Freq: Three times a day (TID) | ORAL | Status: DC | PRN

## 2013-07-16 MED ORDER — MEPERIDINE HCL 25 MG/ML IJ SOLN: 6.2500 mg | INTRAMUSCULAR | Status: DC | PRN

## 2013-07-16 MED ORDER — ONDANSETRON HCL 4 MG/2ML IJ SOLN: 4.0000 mg | Freq: Once | INTRAMUSCULAR | Status: DC | PRN

## 2013-07-16 MED ORDER — KETOROLAC TROMETHAMINE 30 MG/ML IJ SOLN
INTRAMUSCULAR | Status: AC
  Filled 2013-07-16: qty 1

## 2013-07-16 MED ORDER — BUPIVACAINE-EPINEPHRINE 0.5% -1:200000 IJ SOLN
INTRAMUSCULAR | Status: DC | PRN
  Administered 2013-07-16: 10 mL

## 2013-07-16 MED ORDER — FENTANYL CITRATE 0.05 MG/ML IJ SOLN
25.0000 ug | INTRAMUSCULAR | Status: DC | PRN
  Administered 2013-07-16: 50 ug via INTRAVENOUS

## 2013-07-16 MED ORDER — SCOPOLAMINE 1 MG/3DAYS TD PT72
MEDICATED_PATCH | TRANSDERMAL | Status: AC
  Administered 2013-07-16: 1.5 mg via TRANSDERMAL
  Filled 2013-07-16: qty 1

## 2013-07-16 MED ORDER — PHENYLEPHRINE HCL 10 MG/ML IJ SOLN
INTRAMUSCULAR | Status: DC | PRN
  Administered 2013-07-16: 40 ug via INTRAVENOUS

## 2013-07-16 MED ORDER — LACTATED RINGERS IV SOLN
INTRAVENOUS | Status: DC
  Administered 2013-07-16 (×2): via INTRAVENOUS

## 2013-07-16 MED ORDER — ONDANSETRON HCL 4 MG/2ML IJ SOLN
INTRAMUSCULAR | Status: AC
  Filled 2013-07-16: qty 2

## 2013-07-16 MED ORDER — FENTANYL CITRATE 0.05 MG/ML IJ SOLN
INTRAMUSCULAR | Status: DC | PRN
  Administered 2013-07-16: 100 ug via INTRAVENOUS
  Administered 2013-07-16 (×3): 50 ug via INTRAVENOUS

## 2013-07-16 MED ORDER — PROMETHAZINE HCL 25 MG/ML IJ SOLN
INTRAMUSCULAR | Status: AC
  Filled 2013-07-16: qty 1

## 2013-07-16 MED ORDER — LACTATED RINGERS IR SOLN
Status: DC | PRN
  Administered 2013-07-16: 3000 mL

## 2013-07-16 MED ORDER — PROMETHAZINE HCL 25 MG/ML IJ SOLN
6.2500 mg | INTRAMUSCULAR | Status: DC | PRN
  Administered 2013-07-16: 6.25 mg via INTRAVENOUS

## 2013-07-16 MED ORDER — LIDOCAINE HCL (CARDIAC) 20 MG/ML IV SOLN
INTRAVENOUS | Status: AC
  Filled 2013-07-16: qty 5

## 2013-07-16 MED ORDER — LIDOCAINE HCL (CARDIAC) 20 MG/ML IV SOLN
INTRAVENOUS | Status: DC | PRN
Start: 2013-07-16 — End: 2013-07-16
  Administered 2013-07-16: 60 mg via INTRAVENOUS

## 2013-07-16 MED ORDER — ONDANSETRON HCL 4 MG PO TABS: 4.0000 mg | ORAL_TABLET | Freq: Three times a day (TID) | ORAL | Status: DC | PRN

## 2013-07-16 MED ORDER — FENTANYL CITRATE 0.05 MG/ML IJ SOLN
INTRAMUSCULAR | Status: AC
  Filled 2013-07-16: qty 2

## 2013-07-16 MED ORDER — PROPOFOL 10 MG/ML IV EMUL
INTRAVENOUS | Status: AC
  Filled 2013-07-16: qty 20

## 2013-07-16 MED ORDER — METOCLOPRAMIDE HCL 5 MG/ML IJ SOLN
INTRAMUSCULAR | Status: AC
  Filled 2013-07-16: qty 2

## 2013-07-16 MED ORDER — ROCURONIUM BROMIDE 100 MG/10ML IV SOLN
INTRAVENOUS | Status: DC | PRN
  Administered 2013-07-16: 10 mg via INTRAVENOUS
  Administered 2013-07-16: 35 mg via INTRAVENOUS
  Administered 2013-07-16: 5 mg via INTRAVENOUS

## 2013-07-16 MED ORDER — KETOROLAC TROMETHAMINE 30 MG/ML IJ SOLN
INTRAMUSCULAR | Status: DC | PRN
  Administered 2013-07-16: 30 mg via INTRAVENOUS
  Administered 2013-07-16: 30 mg via INTRAMUSCULAR

## 2013-07-16 MED ORDER — KETOROLAC TROMETHAMINE 30 MG/ML IJ SOLN: 15.0000 mg | Freq: Once | INTRAMUSCULAR | Status: DC | PRN

## 2013-07-16 SURGICAL SUPPLY — 24 items
ABLATOR ENDOMETRIAL BIPOLAR (ABLATOR) ×4 IMPLANT
ADH SKN CLS APL DERMABOND .7 (GAUZE/BANDAGES/DRESSINGS) ×1
CANISTER SUCT 3000ML (MISCELLANEOUS) ×4 IMPLANT
CATH ROBINSON RED A/P 16FR (CATHETERS) ×4 IMPLANT
CHLORAPREP W/TINT 26ML (MISCELLANEOUS) ×4 IMPLANT
CLOTH BEACON ORANGE TIMEOUT ST (SAFETY) ×4 IMPLANT
CONTAINER PREFILL 10% NBF 60ML (FORM) ×8 IMPLANT
DERMABOND ADVANCED (GAUZE/BANDAGES/DRESSINGS) ×1
DERMABOND ADVANCED .7 DNX12 (GAUZE/BANDAGES/DRESSINGS) ×3 IMPLANT
ELECT REM PT RETURN 9FT ADLT (ELECTROSURGICAL) ×4
ELECTRODE REM PT RTRN 9FT ADLT (ELECTROSURGICAL) ×3 IMPLANT
GLOVE BIOGEL PI IND STRL 8.5 (GLOVE) ×3 IMPLANT
GLOVE BIOGEL PI INDICATOR 8.5 (GLOVE) ×1
GLOVE ECLIPSE 8.0 STRL XLNG CF (GLOVE) ×8 IMPLANT
GOWN STRL REUS W/TWL 2XL LVL3 (GOWN DISPOSABLE) ×4 IMPLANT
GOWN STRL REUS W/TWL LRG LVL3 (GOWN DISPOSABLE) ×8 IMPLANT
LOOP ANGLED CUTTING 22FR (CUTTING LOOP) IMPLANT
PACK LAPAROSCOPY BASIN (CUSTOM PROCEDURE TRAY) ×4 IMPLANT
PACK VAGINAL MINOR WOMEN LF (CUSTOM PROCEDURE TRAY) ×4 IMPLANT
SUT MNCRL AB 4-0 PS2 18 (SUTURE) ×4 IMPLANT
SUT VICRYL 0 UR6 27IN ABS (SUTURE) ×4 IMPLANT
TOWEL OR 17X24 6PK STRL BLUE (TOWEL DISPOSABLE) ×8 IMPLANT
TROCAR BLADELESS OPT 5 75 (ENDOMECHANICALS) ×4 IMPLANT
WATER STERILE IRR 1000ML POUR (IV SOLUTION) ×4 IMPLANT

## 2013-07-16 NOTE — Op Note (Signed)
OPERATIVE NOTE  Judy Weaver  DOB:    [birth year]  MRN:    [id number]  CSN:    [id number]  Date of Surgery:  07/16/2013  Preoperative Diagnosis:  Desires sterilization  Irregular uterine bleeding  Hypertension  Previous umbilical hernia repair using mesh  Postoperative Diagnosis:  same  Procedure:  Removal of intrauterine device  Laparoscopic sterilization by way of bilateral salpingectomy  Diagnostic hysteroscopy  Hysteroscopy with resection of endometrial polyps  Dilatation and curettage  NovaSure ablation of the endometrium  Surgeon:  Gildardo Cranker, M.D.  Assistant:  None  Anesthetic:  General  Disposition:  The patient is a 48 y.o.-year-old female who presents with the above diagnosis. She understands the indications for her surgical procedure. She accepts the risk of, but not limited to, anesthetic complications, bleeding, infections, and possible damage to the surrounding organs.  Findings:  On examination under anesthesia the uterus was upper limits normal size size. No adnexal masses were appreciated. No parametrial disease was appreciated. The uterus sounded to 9 cm. The patient was noted to have a 1 cm endometrial polyp. The bowel and abdominal contents appeared normal.  Procedure:  The patient was taken to the operating room where a general anesthetic was given. The abdomen was prepped with DuraPrep. The perineum and vagina were prepped with Betadine. A Foley catheter was placed in the bladder. The patient was sterilely draped. The left upper quadrant was injected with half percent Marcaine with epinephrine (Palmer's point). A 5 mm incision was made. The Verres needle was placed into the abdominal cavity without difficulty. Proper placement was confirmed using the saline drop test (half percent Marcaine used in stable). Apgar pneumoperitoneum was obtained, the varies needle was removed and a 5 mm trocar was placed into the abdominal  cavity without difficulty. The abdominal cavity was inspected and there was no evidence of damage. The lower abdomen was injected in 2 separate areas with half percent Marcaine and epinephrine. Two 5 mm trocars were placed in the lower abdomen under direct visualization. The abdominal contents were reviewed and pictures were taken. The right proximal fallopian tube was cauterized using the bipolar cautery. The tube was cut using the laparoscopic scissors. The mesosalpinx was then cauterized along the length of the tube. The mesosalpinx was cut freeing the fallopian tube. An identical procedure was carried out on the opposite side. The 5 mm trocar and the left lower quadrant was removed. A 10 mm trocar was placed in the left lower quadrant. Both fallopian tubes were removed through the 10 mm trocar. Abdominal cavity was carefully inspected. Pictures were again taken. Hemostasis was adequate. There was no evidence of damage. The pneumoperitoneum was allowed to escape. All instruments were removed. The fascia and the left lower quadrant was closed using figure-of-eight sutures of 0 Vicryl. All skin incisions were closed using 3-0 Monocryl. Sterile dressings were applied. A paracervical block was placed using 10 cc of half percent Marcaine with epinephrine. An endocervical curettage was performed. The cervix was gently dilated. The diagnostic hysteroscope was inserted and the cavity was carefully inspected. Pictures were taken. Findings included: a 1 cm endometrial polyp. The diagnostic hysteroscope was removed. The cervix was dilated further. The operative hysteroscope was inserted. The polyp were resected using a single loop. The cavity was then curetted using a sharp curet. The NovaSure instrument was then tested. After appropriate measurements of the uterine cavity, the NovaSure instrument was inserted. A test procedure was performed and the instrument was noted to  function properly. The cavity was then curetted for  2 minutes. The NovaSure instrument was removed. The cavity was inspected using the hysteroscope. The cavity was noted to be fully ablated. Hemostasis was adequate. All instruments were removed. The examination was repeated and the uterus was noted to be firm. Sponge, and needle counts were correct. The estimated blood loss for the procedure was 20 cc. The estimated fluid deficit loss 265 cc. The patient was awakened from her anesthetic without difficulty. She was returned to the supine position and and transported to the recovery room in stable condition. The fallopian tubes, endocervical curettings, endometrial resections, and endometrial curettings were sent to pathology.  Followup instructions:  The patient will return to see Dr. Raphael Gibney in 2 weeks. She was given a copy of the postoperative instructions for patients who've undergone hysteroscopy.  Discharge medications:  Motrin 800 mg every 8 hours as needed for mild to moderate pain. Percocet one tablet every 4 hours as needed for severe pain. Zofran 4 mg every 4 hours as needed for nausea.  Gildardo Cranker, M.D.

## 2013-07-16 NOTE — Transfer of Care (Signed)
Immediate Anesthesia Transfer of Care Note  Patient: Judy Weaver  Procedure(s) Performed: Procedure(s): LAPAROSCOPIC TUBAL (Bilateral) NOVASURE ABLATION WITH REMOVAL OF IUD (N/A) HYSTEROSCOPY (N/A) DILATATION & CURETTAGE/HYSTEROSCOPY WITH RESECTOCOPE (N/A)  Patient Location: PACU  Anesthesia Type:General  Level of Consciousness: awake, alert  and oriented  Airway & Oxygen Therapy: Patient Spontanous Breathing and Patient connected to nasal cannula oxygen  Post-op Assessment: Report given to PACU RN and Post -op Vital signs reviewed and stable  Post vital signs: Reviewed and stable  Complications: No apparent anesthesia complications

## 2013-07-16 NOTE — H&P (Signed)
The patient was interviewed and examined today.  The previously documented history and physical examination was reviewed. There are no changes. The operative procedure was reviewed. The risks and benefits were outlined again. The specific risks include, but are not limited to, anesthetic complications, bleeding, infections, and possible damage to the surrounding organs. The patient's questions were answered.  We are ready to proceed as outlined. The likelihood of the patient achieving the goals of this procedure is very likely.   BP 115/74  Pulse 91  Temp(Src) 98.1 F (36.7 C) (Oral)  Resp 20  SpO2 97%  Results for orders placed during the hospital encounter of 07/16/13 (from the past 24 hour(s))  PREGNANCY, URINE     Status: None   Collection Time    07/16/13  8:28 AM      Result Value Ref Range   Preg Test, Ur NEGATIVE  NEGATIVE     Gildardo Cranker, M.D.

## 2013-07-16 NOTE — Anesthesia Preprocedure Evaluation (Signed)
Anesthesia Evaluation  Patient identified by MRN, date of birth, ID band Patient awake    Reviewed: Allergy & Precautions, H&P , NPO status , Patient's Chart, lab work & pertinent test results  Airway Mallampati: I TM Distance: >3 FB Neck ROM: full    Dental no notable dental hx. (+) Teeth Intact   Pulmonary neg pulmonary ROS,    Pulmonary exam normal       Cardiovascular hypertension, Pt. on medications     Neuro/Psych PSYCHIATRIC DISORDERS Depression negative neurological ROS     GI/Hepatic negative GI ROS, Neg liver ROS,   Endo/Other  negative endocrine ROS  Renal/GU negative Renal ROS     Musculoskeletal   Abdominal Normal abdominal exam  (+)   Peds  Hematology negative hematology ROS (+)   Anesthesia Other Findings   Reproductive/Obstetrics negative OB ROS                           Anesthesia Physical Anesthesia Plan  ASA: II  Anesthesia Plan: General   Post-op Pain Management:    Induction: Intravenous  Airway Management Planned: Oral ETT  Additional Equipment:   Intra-op Plan:   Post-operative Plan: Extubation in OR  Informed Consent: I have reviewed the patients History and Physical, chart, labs and discussed the procedure including the risks, benefits and alternatives for the proposed anesthesia with the patient or authorized representative who has indicated his/her understanding and acceptance.   Dental Advisory Given  Plan Discussed with: CRNA and Surgeon  Anesthesia Plan Comments:         Anesthesia Quick Evaluation

## 2013-07-16 NOTE — Discharge Instructions (Addendum)
Diagnostic Laparoscopy Laparoscopy is a surgical procedure. It is used to diagnose and treat diseases inside the belly (abdomen). It is usually a brief, common, and relatively simple procedure. The laparoscopeis a thin, lighted, pencil-sized instrument. It is like a telescope. It is inserted into your abdomen through a small cut (incision). Your caregiver can look at the organs inside your body through this instrument. He or she can see if there is anything abnormal. Laparoscopy can be done either in a hospital or outpatient clinic. You may be given a mild sedative to help you relax before the procedure. Once in the operating room, you will be given a drug to make you sleep (general anesthesia). Laparoscopy usually lasts less than 1 hour. After the procedure, you will be monitored in a recovery area until you are stable and doing well. Once you are home, it will take 2 to 3 days to fully recover. RISKS AND COMPLICATIONS  Laparoscopy has relatively few risks. Your caregiver will discuss the risks with you before the procedure. Some problems that can occur include:  Infection.  Bleeding.  Damage to other organs.  Anesthetic side effects. PROCEDURE Once you receive anesthesia, your surgeon inflates the abdomen with a harmless gas (carbon dioxide). This makes the organs easier to see. The laparoscope is inserted into the abdomen through a small incision. This allows your surgeon to see into the abdomen. Other small instruments are also inserted into the abdomen through other small openings. Many surgeons attach a video camera to the laparoscope to enlarge the view. During a diagnostic laparoscopy, the surgeon may be looking for inflammation, infection, or cancer. Your surgeon may take tissue samples(biopsies). The samples are sent to a specialist in looking at cells and tissue samples (pathologist). The pathologist examines them under a microscope. Biopsies can help to diagnose or confirm a  disease. AFTER THE PROCEDURE   The gas is released from inside the abdomen.  The incisions are closed with stitches (sutures). Because these incisions are small (usually less than 1/2 inch), there is usually minimal discomfort after the procedure. There may be some mild discomfort in the throat. This is from the tube placed in the throat while you were sleeping. You may have some mild abdominal discomfort. There may also be discomfort from the instrument placement incisions in the abdomen.  The recovery time is shortened as long as there are no complications.  You will rest in a recovery room until stable and doing well. As long as there are no complications, you may be allowed to go home. FINDING OUT THE RESULTS OF YOUR TEST Not all test results are available during your visit. If your test results are not back during the visit, make an appointment with your caregiver to find out the results. Do not assume everything is normal if you have not heard from your caregiver or the medical facility. It is important for you to follow up on all of your test results. HOME CARE INSTRUCTIONS   Take all medicines as directed.  Only take over-the-counter or prescription medicines for pain, discomfort, or fever as directed by your caregiver.  Resume daily activities as directed.  Showers are preferred over baths.  You may resume sexual activities in 1 week or as directed.  Do not drive while taking narcotics. SEEK MEDICAL CARE IF:   There is increasing abdominal pain.  There is new pain in the shoulders (shoulder strap areas).  You feel lightheaded or faint.  You have the chills.  You or  your child has an oral temperature above 102 F (38.9 C).  There is pus-like (purulent) drainage from any of the wounds.  You are unable to pass gas or have a bowel movement.  You feel sick to your stomach (nauseous) or throw up (vomit). MAKE SURE YOU:   Understand these instructions.  Will watch  your condition.  Will get help right away if you are not doing well or get worse. Document Released: 06/13/2000 Document Revised: 07/02/2012 Document Reviewed: 03/07/2007 [facility] Patient Information 2014 [place], [place].    Remove the scop patch behind your ear tomorrow.  Be sure to wash your your hands before touching your eyes.

## 2013-07-16 NOTE — Anesthesia Postprocedure Evaluation (Signed)
  Anesthesia Post-op Note  Patient: Judy Weaver  Procedure(s) Performed: Procedure(s): LAPAROSCOPIC TUBAL (Bilateral) NOVASURE ABLATION WITH REMOVAL OF IUD (N/A) HYSTEROSCOPY (N/A) DILATATION & CURETTAGE/HYSTEROSCOPY WITH RESECTOCOPE (N/A)  Patient Location: PACU  Anesthesia Type:General  Level of Consciousness: awake, alert  and oriented  Airway and Oxygen Therapy: Patient Spontanous Breathing  Post-op Pain: mild  Post-op Assessment: Post-op Vital signs reviewed, Patient's Cardiovascular Status Stable, Respiratory Function Stable, Patent Airway, No signs of Nausea or vomiting and Pain level controlled  Post-op Vital Signs: Reviewed and stable  Last Vitals:  Filed Vitals:   07/16/13 1300  BP: 120/73  Pulse: 99  Temp:   Resp: 15    Complications: No apparent anesthesia complications

## 2013-07-16 NOTE — Anesthesia Procedure Notes (Signed)
Procedure Name: Intubation Date/Time: 07/16/2013 10:29 AM Performed by: Flossie Dibble Pre-anesthesia Checklist: Patient being monitored, Suction available, Emergency Drugs available, Patient identified and Timeout performed Patient Re-evaluated:Patient Re-evaluated prior to inductionOxygen Delivery Method: Circle system utilized Preoxygenation: Pre-oxygenation with 100% oxygen Intubation Type: IV induction Ventilation: Mask ventilation without difficulty Laryngoscope Size: Mac, 3, Miller and 2 (Grade 3 view with Mac/Miller, Grade 1 with Glide scope.) Grade View: Grade III Tube type: Oral Tube size: 7.0 mm Number of attempts: 3 (Mac 3 by Margarita Sermons CRNA, epiglottis seen. Miller 2 by  F. Hachet MD,  attemped intubation , Glide # 3 blade by W.  Kada Friesen  grade 1 view, successful.) Airway Equipment and Method: Patient positioned with wedge pillow,  Stylet and Video-laryngoscopy Placement Confirmation: ETT inserted through vocal cords under direct vision,  positive ETCO2 and breath sounds checked- equal and bilateral Secured at: 21 cm Tube secured with: Tape Dental Injury: Teeth and Oropharynx as per pre-operative assessment  Difficulty Due To: Difficulty was unanticipated

## 2013-07-17 ENCOUNTER — Encounter (HOSPITAL_COMMUNITY): Payer: Self-pay | Admitting: Obstetrics and Gynecology

## 2013-08-13 ENCOUNTER — Other Ambulatory Visit: Payer: Self-pay

## 2013-08-13 DIAGNOSIS — Z1231 Encounter for screening mammogram for malignant neoplasm of breast: Secondary | ICD-10-CM

## 2013-08-21 ENCOUNTER — Ambulatory Visit
Admission: RE | Admit: 2013-08-21 | Discharge: 2013-08-21 | Disposition: A | Discharge status: Home / Self Care | Payer: BC Managed Care – PPO | Source: Ambulatory Visit

## 2013-08-21 DIAGNOSIS — Z1231 Encounter for screening mammogram for malignant neoplasm of breast: Secondary | ICD-10-CM

## 2014-01-20 ENCOUNTER — Encounter (HOSPITAL_COMMUNITY): Payer: Self-pay | Admitting: Obstetrics and Gynecology

## 2014-08-13 ENCOUNTER — Other Ambulatory Visit: Payer: Self-pay

## 2014-08-13 DIAGNOSIS — Z1231 Encounter for screening mammogram for malignant neoplasm of breast: Secondary | ICD-10-CM

## 2014-10-21 ENCOUNTER — Ambulatory Visit
Admission: RE | Admit: 2014-10-21 | Discharge: 2014-10-21 | Disposition: A | Discharge status: Home / Self Care | Payer: BLUE CROSS/BLUE SHIELD | Source: Ambulatory Visit

## 2014-10-21 DIAGNOSIS — Z1231 Encounter for screening mammogram for malignant neoplasm of breast: Secondary | ICD-10-CM

## 2015-11-24 ENCOUNTER — Other Ambulatory Visit: Payer: Self-pay | Admitting: Obstetrics and Gynecology

## 2015-11-24 DIAGNOSIS — Z1231 Encounter for screening mammogram for malignant neoplasm of breast: Secondary | ICD-10-CM

## 2015-12-09 ENCOUNTER — Ambulatory Visit: Payer: BLUE CROSS/BLUE SHIELD

## 2015-12-25 ENCOUNTER — Ambulatory Visit
Admission: RE | Admit: 2015-12-25 | Discharge: 2015-12-25 | Disposition: A | Discharge status: Home / Self Care | Payer: BLUE CROSS/BLUE SHIELD | Source: Ambulatory Visit | Attending: Obstetrics and Gynecology | Admitting: Obstetrics and Gynecology

## 2015-12-25 DIAGNOSIS — Z1231 Encounter for screening mammogram for malignant neoplasm of breast: Secondary | ICD-10-CM

## 2015-12-31 ENCOUNTER — Other Ambulatory Visit: Payer: Self-pay | Admitting: Obstetrics and Gynecology

## 2015-12-31 DIAGNOSIS — R928 Other abnormal and inconclusive findings on diagnostic imaging of breast: Secondary | ICD-10-CM

## 2016-01-07 ENCOUNTER — Other Ambulatory Visit: Payer: BLUE CROSS/BLUE SHIELD

## 2016-01-21 ENCOUNTER — Other Ambulatory Visit: Payer: BLUE CROSS/BLUE SHIELD

## 2016-01-29 ENCOUNTER — Other Ambulatory Visit: Payer: BLUE CROSS/BLUE SHIELD

## 2016-01-29 ENCOUNTER — Ambulatory Visit
Admission: RE | Admit: 2016-01-29 | Discharge: 2016-01-29 | Disposition: A | Discharge status: Home / Self Care | Payer: 59 | Source: Ambulatory Visit | Attending: Obstetrics and Gynecology | Admitting: Obstetrics and Gynecology

## 2016-01-29 DIAGNOSIS — R928 Other abnormal and inconclusive findings on diagnostic imaging of breast: Secondary | ICD-10-CM

## 2016-02-02 ENCOUNTER — Other Ambulatory Visit: Payer: BLUE CROSS/BLUE SHIELD

## 2016-10-03 DIAGNOSIS — I10 Essential (primary) hypertension: Secondary | ICD-10-CM | POA: Diagnosis not present

## 2016-10-03 DIAGNOSIS — E782 Mixed hyperlipidemia: Secondary | ICD-10-CM | POA: Diagnosis not present

## 2016-10-13 DIAGNOSIS — E782 Mixed hyperlipidemia: Secondary | ICD-10-CM | POA: Diagnosis not present

## 2016-10-13 DIAGNOSIS — I10 Essential (primary) hypertension: Secondary | ICD-10-CM | POA: Diagnosis not present

## 2017-01-31 DIAGNOSIS — J018 Other acute sinusitis: Secondary | ICD-10-CM | POA: Diagnosis not present

## 2017-03-09 ENCOUNTER — Other Ambulatory Visit: Payer: Self-pay | Admitting: Obstetrics and Gynecology

## 2017-03-09 DIAGNOSIS — Z1231 Encounter for screening mammogram for malignant neoplasm of breast: Secondary | ICD-10-CM

## 2017-04-11 ENCOUNTER — Ambulatory Visit
Admission: RE | Admit: 2017-04-11 | Discharge: 2017-04-11 | Disposition: A | Discharge status: Home / Self Care | Payer: 59 | Source: Ambulatory Visit | Attending: Obstetrics and Gynecology | Admitting: Obstetrics and Gynecology

## 2017-04-11 DIAGNOSIS — Z1231 Encounter for screening mammogram for malignant neoplasm of breast: Secondary | ICD-10-CM | POA: Diagnosis not present

## 2017-04-18 DIAGNOSIS — E782 Mixed hyperlipidemia: Secondary | ICD-10-CM | POA: Diagnosis not present

## 2017-04-18 DIAGNOSIS — R7301 Impaired fasting glucose: Secondary | ICD-10-CM | POA: Diagnosis not present

## 2017-04-19 DIAGNOSIS — I10 Essential (primary) hypertension: Secondary | ICD-10-CM | POA: Diagnosis not present

## 2017-04-19 DIAGNOSIS — E782 Mixed hyperlipidemia: Secondary | ICD-10-CM | POA: Diagnosis not present

## 2017-04-19 DIAGNOSIS — G4733 Obstructive sleep apnea (adult) (pediatric): Secondary | ICD-10-CM | POA: Diagnosis not present

## 2017-05-02 DIAGNOSIS — D225 Melanocytic nevi of trunk: Secondary | ICD-10-CM | POA: Diagnosis not present

## 2017-05-02 DIAGNOSIS — L565 Disseminated superficial actinic porokeratosis (DSAP): Secondary | ICD-10-CM | POA: Diagnosis not present

## 2017-05-02 DIAGNOSIS — L718 Other rosacea: Secondary | ICD-10-CM | POA: Diagnosis not present

## 2017-05-02 DIAGNOSIS — L918 Other hypertrophic disorders of the skin: Secondary | ICD-10-CM | POA: Diagnosis not present

## 2017-10-16 DIAGNOSIS — I10 Essential (primary) hypertension: Secondary | ICD-10-CM | POA: Diagnosis not present

## 2017-10-17 DIAGNOSIS — R7303 Prediabetes: Secondary | ICD-10-CM | POA: Diagnosis not present

## 2017-10-17 DIAGNOSIS — E782 Mixed hyperlipidemia: Secondary | ICD-10-CM | POA: Diagnosis not present

## 2017-10-17 DIAGNOSIS — I10 Essential (primary) hypertension: Secondary | ICD-10-CM | POA: Diagnosis not present

## 2017-10-29 ENCOUNTER — Emergency Department (HOSPITAL_BASED_OUTPATIENT_CLINIC_OR_DEPARTMENT_OTHER)
Admission: EM | Admit: 2017-10-29 | Discharge: 2017-10-29 | Disposition: A | Discharge status: Home / Self Care | Payer: 59 | Attending: Emergency Medicine | Admitting: Emergency Medicine

## 2017-10-29 ENCOUNTER — Other Ambulatory Visit: Payer: Self-pay

## 2017-10-29 ENCOUNTER — Encounter (HOSPITAL_BASED_OUTPATIENT_CLINIC_OR_DEPARTMENT_OTHER): Payer: Self-pay | Admitting: Emergency Medicine

## 2017-10-29 DIAGNOSIS — Z79899 Other long term (current) drug therapy: Secondary | ICD-10-CM | POA: Insufficient documentation

## 2017-10-29 DIAGNOSIS — F329 Major depressive disorder, single episode, unspecified: Secondary | ICD-10-CM | POA: Insufficient documentation

## 2017-10-29 DIAGNOSIS — I10 Essential (primary) hypertension: Secondary | ICD-10-CM | POA: Insufficient documentation

## 2017-10-29 DIAGNOSIS — M545 Low back pain: Secondary | ICD-10-CM | POA: Insufficient documentation

## 2017-10-29 MED ORDER — METHOCARBAMOL 500 MG PO TABS: 500.0000 mg | ORAL_TABLET | Freq: Three times a day (TID) | ORAL | 0 refills | Status: DC | PRN

## 2017-10-29 MED ORDER — PREDNISONE 50 MG PO TABS
50.0000 mg | ORAL_TABLET | Freq: Every day | ORAL | 0 refills | Status: DC
Start: 2017-10-29 — End: 2019-05-15

## 2017-10-29 NOTE — ED Provider Notes (Signed)
[facility] EMERGENCY DEPARTMENT Provider Note   CSN: [id number] Arrival date & time: 10/29/17  1656     History   Chief Complaint Chief Complaint  Patient presents with  . Back Pain    HPI Judy Weaver is a 52 y.o. female with a history of depression, hypertension, and fibroids who presents to the emergency department with complaints of lower back pain that started 3 days ago. Patient states that day prior to onset of discomfort she helped her son move into an apartment and was doing quite a bit of heavy lifting. She states she had pain starting subsequent morning, pain to bilateral lower back, subsequently starting radiating down RLE.  Pain is arrived as a spasm/tightness/sharp and a 10 out of 10 in severity, worse with movement and with walking, no significant and Tylenol without significant relief.  She has a history of similar pain which was attributed to muscles being spasms, this feels a bit worse but similar. Denies numbness, tingling, weakness, incontinence to bowel/bladder, fever, chills, IV drug use, or hx of cancer.  HPI  Past Medical History:  Diagnosis Date  . Abnormal Pap smear   . Breech birth   . Chicken pox   . Depression    Post Partum  . Dysmenorrhea   . Fibroid   . Group B streptococcal infection in pregnancy   . H/O wisdom tooth extraction   . Hypertension    during pregnancy  . Preeclampsia   . Preterm labor     Patient Active Problem List   Diagnosis Date Noted  . Overweight(278.02) 09/29/2011  . Menopausal symptoms 09/29/2011  . Fibroids 08/05/2011    Past Surgical History:  Procedure Laterality Date  . ABDOMINOPLASTY    . AUGMENTATION MAMMAPLASTY Bilateral   . BREAST ENHANCEMENT SURGERY    . BREAST SURGERY    . CESAREAN SECTION    . DILATATION & CURRETTAGE/HYSTEROSCOPY WITH RESECTOCOPE N/A 07/16/2013   Procedure: [place];  Surgeon: Ena Dawley, MD;  Location: [facility];  Service:  Gynecology;  Laterality: N/A;  . HERNIA REPAIR     umbilical  . HYSTEROSCOPY N/A 07/16/2013   Procedure: HYSTEROSCOPY;  Surgeon: Ena Dawley, MD;  Location: [facility];  Service: Gynecology;  Laterality: N/A;  . LAPAROSCOPIC TUBAL LIGATION Bilateral 07/16/2013   Procedure: LAPAROSCOPIC TUBAL;  Surgeon: Ena Dawley, MD;  Location: [facility];  Service: Gynecology;  Laterality: Bilateral;  . NOVASURE ABLATION N/A 07/16/2013   Procedure: NOVASURE ABLATION WITH REMOVAL OF IUD;  Surgeon: Ena Dawley, MD;  Location: [facility];  Service: Gynecology;  Laterality: N/A;  . post partum depression    . WISDOM TOOTH EXTRACTION       OB History    Gravida  3   Para  2   Term      Preterm      AB      Living        SAB      TAB      Ectopic      Multiple      Live Births               Home Medications    Prior to Admission medications   Medication Sig Start Date End Date Taking? Authorizing Provider  buPROPion (WELLBUTRIN XL) 300 MG 24 hr tablet Take 300 mg by mouth daily.    [provider]  citalopram (CELEXA) 40 MG tablet Take 40 mg by mouth daily.  [provider]  ibuprofen (ADVIL,MOTRIN) 800 MG tablet Take 1 tablet (800 mg total) by mouth every 8 (eight) hours as needed. 07/16/13   Ena Dawley, MD  lamoTRIgine (LAMICTAL) 200 MG tablet Take 200 mg by mouth daily.    [provider]  lisinopril-hydrochlorothiazide (PRINZIDE,ZESTORETIC) 20-12.5 MG per tablet Take 1 tablet by mouth daily.    [provider]  Multiple Vitamin (MULTIVITAMIN WITH MINERALS) TABS tablet Take 1 tablet by mouth daily.    [provider]  ondansetron (ZOFRAN) 4 MG tablet Take 1 tablet (4 mg total) by mouth every 8 (eight) hours as needed for nausea or vomiting. 07/16/13   Ena Dawley, MD  oxyCODONE-acetaminophen (ROXICET) 5-325 MG per tablet Take 1 tablet by mouth every 4 (four) hours as needed for severe pain. 07/16/13   Ena Dawley, MD    rosuvastatin (CRESTOR) 10 MG tablet Take 10 mg by mouth daily.    [provider]    Family History Family History  Problem Relation Age of Onset  . Breast cancer Maternal Aunt     Social History Social History   Tobacco Use  . Smoking status: Never Smoker  . Smokeless tobacco: Never Used  Substance Use Topics  . Alcohol use: Yes    Alcohol/week: 2.0 standard drinks    Types: 2 Glasses of wine per week    Comment: social  . Drug use: No     Allergies   Sulfa antibiotics   Review of Systems Review of Systems  Constitutional: Negative for chills and fever.  Respiratory: Negative for shortness of breath.   Cardiovascular: Negative for chest pain.  Gastrointestinal: Negative for abdominal pain and vomiting.  Genitourinary: Negative for decreased urine volume, dysuria and urgency.  Musculoskeletal: Positive for back pain.  Neurological: Negative for weakness and numbness.       Negative for saddle anesthesia or incontinence.    Physical Exam Updated Vital Signs BP 130/72 (BP Location: Left Arm)   Pulse 90   Temp 99.1 F (37.3 C) (Oral)   Resp 20   Ht 5\' 2"  (1.575 m)   Wt 78.5 kg   SpO2 98%   BMI 31.64 kg/m   Physical Exam  Constitutional: She appears well-developed and well-nourished. No distress.  HENT:  Head: Normocephalic and atraumatic.  Eyes: Conjunctivae are normal. Right eye exhibits no discharge. Left eye exhibits no discharge.  Abdominal: Soft. She exhibits no distension. There is no tenderness. There is no rebound and no guarding.  Musculoskeletal:  No obvious deformity, appreciable swelling, erythema, ecchymosis, or open wounds. Back: No midline tenderness to palpation, no crepitus or step-off, patient has bilateral lumbar paraspinal muscle tenderness palpation which extends somewhat to the right gluteal area. Extremities: Normal range of motion to all joints.  Focal areas of tenderness.  Neurological: She is alert.  Clear speech.   Sensation grossly intact bilateral lower extremities.  5 out of 5 strength with plantar dorsiflexion bilaterally.  Patellar DTRs are 2+ and symmetric.  Gait is somewhat antalgic but intact.  Skin: Skin is warm and dry.  Psychiatric: She has a normal mood and affect. Her behavior is normal. Thought content normal.  Nursing note and vitals reviewed.    ED Treatments / Results  Labs (all labs ordered are listed, but only abnormal results are displayed) Labs Reviewed - No data to display  EKG None  Radiology No results found.  Procedures Procedures (including critical care time)  Medications Ordered in ED Medications - No data to  display   Initial Impression / Assessment and Plan / ED Course  I have reviewed the triage vital signs and the nursing notes.  Pertinent labs & imaging results that were available during my care of the patient were reviewed by me and considered in my medical decision making (see chart for details).    Patient presents with complaint of back pain.  Patient is nontoxic appearing, vitals are WNL. Patient has normal neurologic exam, no midline tenderness to palpation. She is ambulatory in the ED.  No back pain red flags. No urinary sxs. Most likely muscle strain versus spasm, query component of sciatica. Considered UTI/pyelonephritis, kidney stone, aortic aneurysm/dissection, cauda equina or epidural abscess however these do not fit clinical picture at this time. Will treat with Prednisone given possible sciatic involvement without significant improvement with NSAIDs at home as well as with Robaxin, discussed with patient that they are not to drive or operate heavy machinery while taking Robaxin. I discussed treatment plan, need for PCP follow-up, and return precautions with the patient. Provided opportunity for questions, patient confirmed understanding and is in agreement with plan.   Final Clinical Impressions(s) / ED Diagnoses   Final diagnoses:  Acute  bilateral low back pain, with sciatica presence unspecified    ED Discharge Orders         Ordered    predniSONE (DELTASONE) 50 MG tablet  Daily     10/29/17 1924    methocarbamol (ROBAXIN) 500 MG tablet  Every 8 hours PRN     10/29/17 1924           Amaryllis Dyke, PA-C 10/29/17 1934    Sherwood Gambler, MD 10/29/17 2332

## 2017-10-29 NOTE — Discharge Instructions (Signed)
Back Pain:  I have prescribed you a steroid and a muscle relaxer  Prednisone-prednisone is a steroid to help with pain and inflammation, please take this daily for the next 5 days.  Robaxin is the muscle relaxer I have prescribed, this is meant to help with muscle tightness. Be aware that this medication may make you drowsy therefore the first time you take this it should be at a time you are in an environment where you can rest. Do not drive or operate heavy machinery when taking this medication.   In addition you may also take Tylenol. Tylenol is generally safe, though you should not take more than 8 of the extra strength (500mg ) pills a day.  The application of heat can help soothe the pain.  Maintaining your daily activities, including walking, is encourged, as it will help you get better faster than just staying in bed.  Low back pain is discomfort in the lower back that may be due to injuries to muscles and ligaments around the spine.  Occasionally, it may be caused by a a problem to a part of the spine called a disc.  The pain may last several days or a few weeks.   Your pain should get better over the next 2 weeks.  You will need to follow up with  Your primary healthcare provider in 1-2 weeks for reassessment, if you do not have a primary care provider one is provided in your discharge instructions. However if you develop severe or worsening pain, low back pain with fever, numbness, weakness, loss of bowel or bladder control, or inability to walk or urinate, you should return to the ER immediately.  Please follow up with your doctor this week for a recheck if still having symptoms.

## 2017-10-29 NOTE — ED Triage Notes (Signed)
Low back pain since Friday, radiating down R leg after bending over.

## 2018-01-04 ENCOUNTER — Ambulatory Visit: Payer: 59 | Admitting: Mental Health

## 2018-01-05 ENCOUNTER — Ambulatory Visit: Payer: 59 | Admitting: Mental Health

## 2018-01-11 ENCOUNTER — Ambulatory Visit (INDEPENDENT_AMBULATORY_CARE_PROVIDER_SITE_OTHER): Payer: 59 | Admitting: Mental Health

## 2018-01-11 DIAGNOSIS — F331 Major depressive disorder, recurrent, moderate: Secondary | ICD-10-CM

## 2018-01-11 DIAGNOSIS — F39 Unspecified mood [affective] disorder: Secondary | ICD-10-CM

## 2018-01-11 DIAGNOSIS — Z23 Encounter for immunization: Secondary | ICD-10-CM | POA: Diagnosis not present

## 2018-01-11 NOTE — Progress Notes (Signed)
[facility] Counselor Initial Adult Exam  Name: Judy Weaver Date: 01/11/2018 MRN: [id number] DOB: 1965-04-18 PCP: Cari Caraway, MD  Time spent: 45 minutes   Informant: patient  Guardian/Payee: patient  Paperwork requested:  No   Reason for Visit /Presenting Problem: Depression, anxiety Mental Status Exam:   Appearance:   Well Groomed     Behavior:  Agitated  Motor:  Normal  Speech/Language:   Clear and Coherent  Affect:  Depressed  Mood:  anxious and depressed  Thought process:  Coherent  Thought content:    WDL and Logical  Perceptual disturbances:    Normal  Orientation:  Full (Time, Place, and Person)  Attention:  Good  Concentration:  down slightly  Memory:  Immediate  Fund of knowledge:   Good  Insight:    Good  Judgment:   Good  Impulse Control:  good    Reported Symptoms:  Grieving breakup of daughter with her  Boyfriend. Depression. Worry about children.  Risk Assessment: Danger to Self:  No Self-injurious Behavior: No Danger to Others: No Duty to Warn:no Physical Aggression / Violence:No  Access to Firearms a concern: No  Gang Involvement:No  Patient / guardian was educated about steps to take if suicide or homicide risk level increases between visits: no While future psychiatric events cannot be accurately predicted, the patient does not currently require acute inpatient psychiatric care and does not currently meet [facility] involuntary commitment criteria.  Substance Abuse History: Current substance abuse: No     Past Psychiatric History:   No previous psychological problems have been observed Outpatient Providers: unknown History of Psych Hospitalization: No  Psychological Testing: none    Medical History/Surgical History:reviewed Past Medical History:  Diagnosis Date  . Abnormal Pap smear   . Breech birth   . Chicken pox   . Depression    Post Partum  . Dysmenorrhea   . Fibroid   . Group B streptococcal infection in pregnancy    . H/O wisdom tooth extraction   . Hypertension    during pregnancy  . Preeclampsia   . Preterm labor    Past Surgical History:  Procedure Laterality Date  . ABDOMINOPLASTY    . AUGMENTATION MAMMAPLASTY Bilateral   . BREAST ENHANCEMENT SURGERY    . BREAST SURGERY    . CESAREAN SECTION    . DILATATION & CURRETTAGE/HYSTEROSCOPY WITH RESECTOCOPE N/A 07/16/2013   Procedure: [place];  Surgeon: Ena Dawley, MD;  Location: [facility];  Service: Gynecology;  Laterality: N/A;  . HERNIA REPAIR     umbilical  . HYSTEROSCOPY N/A 07/16/2013   Procedure: HYSTEROSCOPY;  Surgeon: Ena Dawley, MD;  Location: [facility];  Service: Gynecology;  Laterality: N/A;  . LAPAROSCOPIC TUBAL LIGATION Bilateral 07/16/2013   Procedure: LAPAROSCOPIC TUBAL;  Surgeon: Ena Dawley, MD;  Location: [facility];  Service: Gynecology;  Laterality: Bilateral;  . NOVASURE ABLATION N/A 07/16/2013   Procedure: NOVASURE ABLATION WITH REMOVAL OF IUD;  Surgeon: Ena Dawley, MD;  Location: [facility];  Service: Gynecology;  Laterality: N/A;  . post partum depression    . WISDOM TOOTH EXTRACTION      Medications: Current Outpatient Medications  Medication Sig Dispense Refill  . buPROPion (WELLBUTRIN XL) 300 MG 24 hr tablet Take 300 mg by mouth daily.    . citalopram (CELEXA) 40 MG tablet Take 40 mg by mouth daily.    Marland Kitchen ibuprofen (ADVIL,MOTRIN) 800 MG tablet Take 1 tablet (800 mg total) by mouth every 8 (eight) hours  as needed. 50 tablet 1  . lamoTRIgine (LAMICTAL) 200 MG tablet Take 200 mg by mouth daily.    Marland Kitchen lisinopril-hydrochlorothiazide (PRINZIDE,ZESTORETIC) 20-12.5 MG per tablet Take 1 tablet by mouth daily.    . methocarbamol (ROBAXIN) 500 MG tablet Take 1 tablet (500 mg total) by mouth every 8 (eight) hours as needed. 30 tablet 0  . Multiple Vitamin (MULTIVITAMIN WITH MINERALS) TABS tablet Take 1 tablet by mouth daily.    . ondansetron (ZOFRAN) 4 MG tablet Take 1 tablet (4 mg  total) by mouth every 8 (eight) hours as needed for nausea or vomiting. 40 tablet 0  . oxyCODONE-acetaminophen (ROXICET) 5-325 MG per tablet Take 1 tablet by mouth every 4 (four) hours as needed for severe pain. 30 tablet 0  . predniSONE (DELTASONE) 50 MG tablet Take 1 tablet (50 mg total) by mouth daily. 5 tablet 0  . rosuvastatin (CRESTOR) 10 MG tablet Take 10 mg by mouth daily.     No current facility-administered medications for this visit.    Allergies  Allergen Reactions  . Sulfa Antibiotics Nausea And Vomiting    Diagnoses:  No diagnosis found.   Part II to be continued at next session:   No. Rosary Lively, [facility] 01/11/2018   ADULT PSYCHOSOCIAL ASSESSMENT Part II Abuse History: Victim of No., none   Report needed: No. Victim of Neglect:No. Perpetrator of NA  Witness / Exposure to Domestic Violence: No   Protective Services Involvement: No  Witness to [employer] Violence:  No   Family History:  Family History  Problem Relation Age of Onset  . Breast cancer Maternal Aunt     Social History:  Social History   Socioeconomic History  . Marital status: Married    Spouse name: Not on file  . Number of children: Not on file  . Years of education: Not on file  . Highest education level: Not on file  Occupational History  . Not on file  Social Needs  . Financial resource strain: Not on file  . Food insecurity:    Worry: Not on file    Inability: Not on file  . Transportation needs:    Medical: Not on file    Non-medical: Not on file  Tobacco Use  . Smoking status: Never Smoker  . Smokeless tobacco: Never Used  Substance and Sexual Activity  . Alcohol use: Yes    Alcohol/week: 2.0 standard drinks    Types: 2 Glasses of wine per week    Comment: social  . Drug use: No  . Sexual activity: Yes    Birth control/protection: Surgical  Lifestyle  . Physical activity:    Days per week: Not on file    Minutes per session: Not on file  . Stress: Not on file   Relationships  . Social connections:    Talks on phone: Not on file    Gets together: Not on file    Attends religious service: Not on file    Active member of club or organization: Not on file    Attends meetings of clubs or organizations: Not on file    Relationship status: Not on file  Other Topics Concern  . Not on file  Social History Narrative  . Not on file    Living situation: the patient lives with their family  Sexual Orientation:  Straight  Relationship Status: married  Name of spouse / other Percell Miller             If a parent,  number of children / ages:20, [place]; spouse friends  Museum/gallery curator Stress:  Yes   Income/Employment/Disability: Employment  Armed forces logistics/support/administrative officer: No   Educational History: Education: Audiological scientist  Religion/Sprituality/World View:   Christian  Any cultural differences that may affect / interfere with treatment:  not applicable   Recreation/Hobbies: reading  Stressors:Other: Adolescent children  Strengths:  Family and Friends  Barriers:  none   Legal History: Pending legal issue / charges: The patient has no significant history of legal issues. History of legal issue / charges: none   Rosary Lively, [facility]

## 2018-01-30 ENCOUNTER — Ambulatory Visit (INDEPENDENT_AMBULATORY_CARE_PROVIDER_SITE_OTHER): Payer: 59 | Admitting: Mental Health

## 2018-01-30 DIAGNOSIS — F39 Unspecified mood [affective] disorder: Secondary | ICD-10-CM | POA: Diagnosis not present

## 2018-01-30 NOTE — Progress Notes (Signed)
      [facility] Counselor/Therapist Progress Note   Patient ID: CLEMENCIA HELZER, MRN: [id number]  Date: 01/30/2018  Timespent: 45 minutes    Treatment Type: Individual   Reported Symptoms: worry, sadness, concern about children   Mental Status Exam:    Appearance:   Well Groomed     Behavior:  Appropriate and Sharing  Motor:  Normal  Speech/Language:   Clear and Coherent  Affect:  concerned  Mood:  anxious and despondent, disappointed  Thought process:  normal  Thought content:    WNL  Sensory/Perceptual disturbances:    WNL  Orientation:  oriented to person, place and time/date  Attention:  Good  Concentration:  Good  Memory:  WNL  Fund of knowledge:   Good  Insight:    Good  Judgment:   Good  Impulse Control:  Good     Risk Assessment: Danger to Self:  No Self-injurious Behavior: No Danger to Others: No Duty to Warn:no Physical Aggression / Violence:No  Access to Firearms a concern: No  Gang Involvement:No    Subjective: Discomfited. Did not have closure with daughter's boyfriend who rejected daughter suddenly. Concerned about son going to [place] with his depressive personality and poor self image.    Interventions: Cognitive Behavioral Therapy, Solution-Oriented/Positive Psychology, Interpersonal and supportive   Diagnosis:   ICD-10-CM   1. 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

## 2018-02-13 ENCOUNTER — Ambulatory Visit: Payer: 59 | Admitting: Mental Health

## 2018-04-19 DIAGNOSIS — E782 Mixed hyperlipidemia: Secondary | ICD-10-CM | POA: Diagnosis not present

## 2018-04-19 DIAGNOSIS — Z713 Dietary counseling and surveillance: Secondary | ICD-10-CM | POA: Diagnosis not present

## 2018-04-19 DIAGNOSIS — I10 Essential (primary) hypertension: Secondary | ICD-10-CM | POA: Diagnosis not present

## 2018-04-19 DIAGNOSIS — E669 Obesity, unspecified: Secondary | ICD-10-CM | POA: Diagnosis not present

## 2018-04-20 DIAGNOSIS — I10 Essential (primary) hypertension: Secondary | ICD-10-CM | POA: Diagnosis not present

## 2018-04-20 DIAGNOSIS — R7303 Prediabetes: Secondary | ICD-10-CM | POA: Diagnosis not present

## 2018-04-20 DIAGNOSIS — E782 Mixed hyperlipidemia: Secondary | ICD-10-CM | POA: Diagnosis not present

## 2018-05-28 DIAGNOSIS — J019 Acute sinusitis, unspecified: Secondary | ICD-10-CM | POA: Diagnosis not present

## 2018-12-04 ENCOUNTER — Other Ambulatory Visit: Payer: Self-pay | Admitting: Family Medicine

## 2018-12-04 DIAGNOSIS — Z1231 Encounter for screening mammogram for malignant neoplasm of breast: Secondary | ICD-10-CM

## 2019-01-16 ENCOUNTER — Ambulatory Visit
Admission: RE | Admit: 2019-01-16 | Discharge: 2019-01-16 | Disposition: A | Discharge status: Home / Self Care | Payer: 59 | Source: Ambulatory Visit | Attending: Family Medicine | Admitting: Family Medicine

## 2019-01-16 ENCOUNTER — Other Ambulatory Visit: Payer: Self-pay

## 2019-01-16 DIAGNOSIS — Z1231 Encounter for screening mammogram for malignant neoplasm of breast: Secondary | ICD-10-CM

## 2019-01-18 ENCOUNTER — Other Ambulatory Visit: Payer: Self-pay | Admitting: Family Medicine

## 2019-01-18 DIAGNOSIS — R928 Other abnormal and inconclusive findings on diagnostic imaging of breast: Secondary | ICD-10-CM

## 2019-01-23 ENCOUNTER — Other Ambulatory Visit: Payer: Self-pay

## 2019-01-23 ENCOUNTER — Ambulatory Visit
Admission: RE | Admit: 2019-01-23 | Discharge: 2019-01-23 | Disposition: A | Discharge status: Home / Self Care | Payer: 59 | Source: Ambulatory Visit | Attending: Family Medicine | Admitting: Family Medicine

## 2019-01-23 DIAGNOSIS — R928 Other abnormal and inconclusive findings on diagnostic imaging of breast: Secondary | ICD-10-CM

## 2019-05-06 ENCOUNTER — Encounter (INDEPENDENT_AMBULATORY_CARE_PROVIDER_SITE_OTHER): Payer: Self-pay

## 2019-05-15 ENCOUNTER — Ambulatory Visit (INDEPENDENT_AMBULATORY_CARE_PROVIDER_SITE_OTHER): Payer: 59 | Admitting: Family Medicine

## 2019-05-15 ENCOUNTER — Other Ambulatory Visit: Payer: Self-pay

## 2019-05-15 ENCOUNTER — Encounter (INDEPENDENT_AMBULATORY_CARE_PROVIDER_SITE_OTHER): Payer: Self-pay | Admitting: Family Medicine

## 2019-05-15 VITALS — BP 106/64 | HR 93 | Temp 98.2°F | Ht 61.0 in | Wt 180.0 lb

## 2019-05-15 DIAGNOSIS — G4733 Obstructive sleep apnea (adult) (pediatric): Secondary | ICD-10-CM

## 2019-05-15 DIAGNOSIS — Z9189 Other specified personal risk factors, not elsewhere classified: Secondary | ICD-10-CM | POA: Diagnosis not present

## 2019-05-15 DIAGNOSIS — Z0289 Encounter for other administrative examinations: Secondary | ICD-10-CM

## 2019-05-15 DIAGNOSIS — R7303 Prediabetes: Secondary | ICD-10-CM

## 2019-05-15 DIAGNOSIS — R0602 Shortness of breath: Secondary | ICD-10-CM | POA: Diagnosis not present

## 2019-05-15 DIAGNOSIS — Z6834 Body mass index (BMI) 34.0-34.9, adult: Secondary | ICD-10-CM

## 2019-05-15 DIAGNOSIS — R5383 Other fatigue: Secondary | ICD-10-CM | POA: Diagnosis not present

## 2019-05-15 DIAGNOSIS — F418 Other specified anxiety disorders: Secondary | ICD-10-CM | POA: Diagnosis not present

## 2019-05-15 DIAGNOSIS — E559 Vitamin D deficiency, unspecified: Secondary | ICD-10-CM

## 2019-05-15 DIAGNOSIS — E669 Obesity, unspecified: Secondary | ICD-10-CM

## 2019-05-15 DIAGNOSIS — I10 Essential (primary) hypertension: Secondary | ICD-10-CM

## 2019-05-15 DIAGNOSIS — R609 Edema, unspecified: Secondary | ICD-10-CM

## 2019-05-15 DIAGNOSIS — E7849 Other hyperlipidemia: Secondary | ICD-10-CM

## 2019-05-15 NOTE — Progress Notes (Signed)
Office: [phone]  /  Fax: [phone]    Date: May 22, 2019   Appointment Start Time: 3:00pm Duration: 56 minutes Provider: Glennie Isle, Psy.D. Type of Session: Intake for Individual Therapy  Location of Patient: Parked in car at work Location of Provider: Provider's Home Type of Contact: Telepsychological Visit via [employer]  Informed Consent: Prior to proceeding with today's appointment, two pieces of identifying information were obtained. In addition, Judy Weaver's physical location at the time of this appointment was obtained as well a phone number she could be reached at in the event of technical difficulties. Judy Weaver and this provider participated in today's telepsychological service.   The provider's role was explained to [employer]. The provider reviewed and discussed issues of confidentiality, privacy, and limits therein (e.g., reporting obligations). In addition to verbal informed consent, written informed consent for psychological services was obtained prior to the initial appointment. Since the clinic is not a 24/7 crisis center, mental health emergency resources were shared and this  provider explained MyChart, e-mail, voicemail, and/or other messaging systems should be utilized only for non-emergency reasons. This provider also explained that information obtained during appointments will be placed in [facility] medical record and relevant information will be shared with other providers at [facility] for coordination of care. Moreover, Judy Weaver agreed information may be shared with other [facility] providers as needed for coordination of care. By signing the service agreement document, Judy Weaver provided written consent for coordination of care. Prior to initiating telepsychological services, Judy Weaver completed an informed consent document, which included the development of a safety plan (i.e., an emergency contact, nearest emergency room, and emergency  resources) in the event of an emergency/crisis. Judy Weaver expressed understanding of the rationale of the safety plan. Judy Weaver verbally acknowledged understanding she is ultimately responsible for understanding her insurance benefits for telepsychological and in-person services. This provider also reviewed confidentiality, as it relates to telepsychological services, as well as the rationale for telepsychological services (i.e., to reduce exposure risk to COVID-19). Judy Weaver  acknowledged understanding that appointments cannot be recorded without both party consent and she is aware she is responsible for securing confidentiality on her end of the session. Judy Weaver verbally consented to proceed.  Chief Complaint/HPI: Judy Weaver was referred by Dr. Ilene Qua due to depression with anxiety. Per the note for the initial visit with Dr. Ilene Qua on May 15, 2019, "Judy Weaver struggles with emotional eating and using food for comfort to the extent that it is negatively impacting her health. She is emotional eating three to five times a week. Judy Weaver is on Celexa, Wellbutrin and Lamictal. She shows no sign of suicidal or homicidal ideations."The note for the initial appointment with Dr. Ilene Qua indicated the following: "Judy Weaver's habits were reviewed today and are as follows: Her family eats meals together, her desired weight loss is 35 to 40 pounds, she started gaining weight in her 40's, her heaviest weight ever was 185 pounds, she has significant food cravings issues, she snacks frequently in the evenings, she wakes up sometimes in the middle of the night to eat, she skips meals frequently, she is frequently drinking liquids with calories, she frequently makes poor food choices, she has problems with excessive hunger, she frequently eats larger portions than normal, she has binge eating behaviors and she struggles with emotional eating." Judy Weaver's Food and Mood (modified PHQ-9) score on May 15, 2019 was  13.  During today's appointment, Judy Weaver was verbally administered a questionnaire assessing various behaviors related to  emotional eating. Judy Weaver endorsed the following: overeat when you are celebrating, experience food cravings on a regular basis, eat certain foods when you are anxious, stressed, depressed, or your feelings are hurt, use food to help you cope with emotional situations, find food is comforting to you, overeat when you are angry or upset, overeat when you are worried about something, not worry about what you eat when you are in a good mood, eat to help you stay awake and eat as a reward. She shared she craves chocolate, adding her cravings vary. Judy Weaver believes the onset of emotional eating was likely during her teenage years, and described the current frequency of emotional eating as "few times a week." In addition, Judy Weaver denied a history of binge eating. Judy Weaver denied a history of restricting food intake, purging and engagement in other compensatory strategies, and has never been diagnosed with an eating disorder. She also denied a history of treatment for emotional eating. Moreover, Judy Weaver indicated worry, stress, and feeling really happy triggers emotional eating. She is unsure what makes emotional eating better. Furthermore, Judy Weaver denied other problems of concern.    Mental Status Examination:  Appearance: well groomed and appropriate hygiene  Behavior: appropriate to circumstances Mood: euthymic Affect: mood congruent Speech: normal in rate, volume, and tone Eye Contact: appropriate Psychomotor Activity: appropriate Gait: unable to assess Thought Process: linear, logical, and goal directed  Thought Content/Perception: denies suicidal and homicidal ideation, plan, and intent and no hallucinations, delusions, bizarre thinking or behavior reported or observed Orientation: time, person, place and purpose of appointment Memory/Concentration: memory, attention, language, and fund of  knowledge intact  Insight/Judgment: good  Family & Psychosocial History: Judy Weaver reported she is married and she has two children (ages 41 and 18). 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 Additionally, Judy Weaver shared her highest level of education obtained is an associate's degree. Currently, Judy Weaver's social support system consists of her friends and mother. Moreover, Judy Weaver stated she resides with her husband and daughter.   Medical History:  Past Medical History:  Diagnosis Date  . Abnormal Pap smear   . Bilateral swelling of feet   . Breech birth   . Chicken pox   . Depression    Post Partum  . Dysmenorrhea   . Fibroid   . Group B streptococcal infection in pregnancy   . H/O wisdom tooth extraction   . High cholesterol   . Hypertension    during pregnancy  . Pre-diabetes   . Preeclampsia   . Preterm labor   . Shortness of breath   . Sleep apnea    Past Surgical History:  Procedure Laterality Date  . ABDOMINOPLASTY    . AUGMENTATION MAMMAPLASTY Bilateral   . BREAST ENHANCEMENT SURGERY    . BREAST SURGERY    . CESAREAN SECTION    . DILATATION & CURRETTAGE/HYSTEROSCOPY WITH RESECTOCOPE N/A 07/16/2013   Procedure: [place]ley;  Surgeon: Ena Dawley, MD;  Location: [facility];  Service: Gynecology;  Laterality: N/A;  . HERNIA REPAIR     umbilical  . HYSTEROSCOPY N/A 07/16/2013   Procedure: HYSTEROSCOPY;  Surgeon: Ena Dawley, MD;  Location: [facility];  Service: Gynecology;  Laterality: N/A;  . LAPAROSCOPIC TUBAL LIGATION Bilateral 07/16/2013   Procedure: LAPAROSCOPIC TUBAL;  Surgeon: Ena Dawley, MD;  Location: [facility];  Service: Gynecology;  Laterality: Bilateral;  . NOVASURE ABLATION N/A 07/16/2013   Procedure: NOVASURE ABLATION WITH REMOVAL OF IUD;  Surgeon: Ena Dawley, MD;  Location: [facility];  Service: Gynecology;  Laterality: N/A;  . post partum depression    . WISDOM TOOTH EXTRACTION     Current  Outpatient Medications on File Prior to Visit  Medication Sig Dispense Refill  . acetaminophen (TYLENOL) 325 MG tablet Take 650 mg by mouth every 6 (six) hours as needed.    Marland Kitchen aspirin EC 81 MG tablet Take 81 mg by mouth daily.    . Aspirin-Salicylamide-Caffeine (BC HEADACHE POWDER PO) Take by mouth.    Marland Kitchen buPROPion (WELLBUTRIN XL) 300 MG 24 hr tablet Take 300 mg by mouth daily.    . Cholecalciferol (VITAMIN D3) 50 MCG (2000 UT) TABS Take by mouth.    . citalopram (CELEXA) 40 MG tablet Take 40 mg by mouth daily.    Marland Kitchen ibuprofen (ADVIL,MOTRIN) 800 MG tablet Take 1 tablet (800 mg total) by mouth every 8 (eight) hours as needed. 50 tablet 1  . lamoTRIgine (LAMICTAL) 200 MG tablet Take 200 mg by mouth daily.    Marland Kitchen lisinopril (ZESTRIL) 10 MG tablet Take 10 mg by mouth daily.    . Multiple Vitamin (MULTIVITAMIN WITH MINERALS) TABS tablet Take 1 tablet by mouth daily.    . rosuvastatin (CRESTOR) 10 MG tablet Take 10 mg by mouth daily.     No current facility-administered medications on file prior to visit.  Willy denied a history of head injuries and loss of consciousness.   Mental Health History: Judy Weaver reported she currently meets with a therapist once a month Judy Weaver with [facility]). She noted she initiated services to address her relationship with her daughter. Judy Weaver stated she plans to schedule an appointment with Judy Weaver toward the end of this month. Focus of treatment does not include eating concerns. Oria reported willingness to inform Judy Weaver about meeting with this provider and signing an authorization for coordination of care. Prior to her current therapist, she noted she met with Judy Weaver, [facility], adding she only met with her for two appointments. Previously, Judy Weaver recalled receiving therapeutic services in her early 37s to address relationship concerns. Judy Weaver stated she attended therapy on and off since then. She noted she also experienced post-partum depression after her  son was born, noting she was prescribed an antidepressant by her OB/GYN when she was pregnant with her daughter. Judy Weaver reported there is no history of hospitalizations for psychiatric concerns, and she has never met with a psychiatrist. Honestii stated she is currently prescribed Wellbutrin, Celexa, and Lamictal by her PCP. Deonna denied a family history of mental health related concerns. Laverda reported there is no history of trauma including psychological, physical  and sexual abuse, as well as neglect.   Judy Weaver described her typical mood lately as "more irritable." She acknowledged feeling "more short" with others, but believes it is secondary to work stress and worry about her mother's health. Aside from concerns noted above and endorsed on the PHQ-9 and GAD-7, Jessicah reported experience decreased motivation. Mardel denied current alcohol use. She denied tobacco use. She denied illicit/recreational substance use. Regarding caffeine intake, Hope reported consuming 1-2 cups of sweet tea and 12 oz. soda daily. Furthermore, [name] indicated she is not experiencing the following: hallucinations and delusions, paranoia, symptoms of mania (e.g., expansive mood, flighty ideas, decreased need for sleep, engagement in risky behaviors), social withdrawal, crying spells and panic attacks. She also denied history of and current suicidal ideation, plan, and intent; history of and current homicidal ideation, plan, and intent; and history of and current engagement in  self-harm.  The following strengths were reported by [name]: caring and empathic. The following strengths were observed by this provider: ability to express thoughts and feelings during the therapeutic session, ability to establish and benefit from a therapeutic relationship, willingness to work toward established goal(s) with the clinic and ability to engage in reciprocal conversation.  Legal History: Deeksha reported there is no history of legal involvement.    Structured Assessments Results: The Patient Health Questionnaire-9 (PHQ-9) is a self-report measure that assesses symptoms and severity of depression over the course of the last two weeks. Ramla obtained a score of 7 suggesting mild depression. Faizah finds the endorsed symptoms to be somewhat difficult. [0= Not at all; 1= Several days; 2= More than half the days; 3= Nearly every day] Little interest or pleasure in doing things 0  Feeling down, depressed, or hopeless 1  Trouble falling or staying asleep, or sleeping too much 0  Feeling tired or having little energy 3  Poor appetite or overeating 1  Feeling bad about yourself --- or that you are a failure or have let yourself or your family down 0  Trouble concentrating on things, such as reading the newspaper or watching television 2  Moving or speaking so slowly that other people could have noticed? Or the opposite --- being so fidgety or restless that you have been moving around a lot more than usual 0  Thoughts that you would be better off dead or hurting yourself in some way 0  PHQ-9 Score 7    The Generalized Anxiety Disorder-7 (GAD-7) is a brief self-report measure that assesses symptoms of anxiety over the course of the last two weeks. Angella obtained a score of 7 suggesting mild anxiety. Caroll finds the endorsed symptoms to be somewhat difficult. [0= Not at all; 1= Several days; 2= Over half the days; 3= Nearly every day] Feeling nervous, anxious, on edge 1  Not being able to stop or control worrying 0  Worrying too much about different things 2  Trouble relaxing 3  Being so restless that it's hard to sit still 0  Becoming easily annoyed or irritable 1  Feeling afraid as if something awful might happen 0  GAD-7 Score 7   Interventions:  Conducted a chart review Focused on rapport building Verbally administered PHQ-9 and GAD-7 for symptom monitoring Verbally administered Food & Mood questionnaire to assess various behaviors  related to emotional eating. Provided emphatic reflections and validation Collaborated with patient on a treatment goal  Psychoeducation provided regarding physical versus emotional hunger  Provisional DSM-5 Diagnosis(es): 311 (F32.8) Other Specified Depressive Disorder, Emotional Eating Behaviors  Plan: Cathie appears able and willing to participate as evidenced by collaboration on a treatment goal, engagement in reciprocal conversation, and asking questions as needed for clarification. The next appointment will be scheduled in two weeks, which will be via [employer]. The following treatment goal was established: decrease emotional eating. This provider will regularly review the treatment plan and medical chart to keep informed of status changes. Tonna expressed understanding and agreement with the initial treatment plan of care. Varshini will be sent a handout via e-mail to utilize between now and the next appointment to increase awareness of hunger patterns and subsequent eating. Kelani provided verbal consent during today's appointment for this provider to send the handout via e-mail.

## 2019-05-15 NOTE — Progress Notes (Signed)
Chief Complaint:   OBESITY Judy Weaver (MR# [id number]) is a 54 y.o. female who presents for evaluation and treatment of obesity and related comorbidities. Judy Weaver heard about our clinic from a co-worker who is a patient here. Current BMI is Body mass index is 34.01 kg/m.Marland Kitchen Judy Weaver has been struggling with her weight for many years and has been unsuccessful in either losing weight, maintaining weight loss, or reaching her healthy weight goal.  Judy Weaver is currently in the action stage of change and ready to dedicate time achieving and maintaining a healthier weight. Judy Weaver is interested in becoming our patient and working on intensive lifestyle modifications including (but not limited to) diet and exercise for weight loss.  Judy Weaver's habits were reviewed today and are as follows: Her family eats meals together, her desired weight loss is 35 to 40 pounds, she started gaining weight in her 40's, her heaviest weight ever was 185 pounds, she has significant food cravings issues, she snacks frequently in the evenings, she wakes up sometimes in the middle of the night to eat, she skips meals frequently, she is frequently drinking liquids with calories, she frequently makes poor food choices, she has problems with excessive hunger, she frequently eats larger portions than normal, she has binge eating behaviors and she struggles with emotional eating.  Judy Weaver often skips breakfast. Lunch consists of a sandwich; [place] or roast beef, chips (L[employer] baked), and a cookie (eats all, feels full). Snack consists of chicken salad and crackers or fruit. Dinner consists of [employer] sandwich meal with fries or mac-n-cheese, and sweet tea (feels satisfied). After dinner Brittnae has a sweet snack, 3 to 4 times a week.  Depression Screen Judy Weaver's Food and Mood (modified PHQ-9) score was moderately positive. PHQ 9 Score is 13  Depression screen PHQ 2/9 05/15/2019  Decreased Interest 1  Down, Depressed, Hopeless 1  PHQ  - 2 Score 2  Altered sleeping 1  Tired, decreased energy 2  Change in appetite 3  Feeling bad or failure about yourself  1  Trouble concentrating 3  Moving slowly or fidgety/restless 1  Suicidal thoughts 0  PHQ-9 Score 13  Difficult doing work/chores Not difficult at all   Subjective:   Other fatigue  Judy Weaver admits to daytime somnolence and admits to waking up still tired. Patent has a history of symptoms of daytime fatigue, morning fatigue, Epworth sleepiness scale, morning headache and hypertension. Judy Weaver generally gets 4 or 5 hours of sleep per night, and states that she sometimes has restful sleep. Snoring is present. Apneic episodes are present. Epworth Sleepiness Score is 22. EKG ordered today shows normal sinus rhythm at 93 BPM.  SOB (shortness of breath) on exertion Judy Weaver Pulse notes increasing shortness of breath with exercising and seems to be worsening over time with weight gain. She notes getting out of breath sooner with activity than she used to. This has not gotten worse recently. [place] denies shortness of breath at rest or orthopnea. EKG ordered today shows normal sinus rhythm at 93 BPM.  Essential hypertension Judy Weaver is on Lisinopril, which started after her pregnancy with her first child. She tried numerous other medications prior to Lisinopril.  BP Readings from Last 3 Encounters:  05/15/19 106/64  10/29/17 121/79  07/16/13 127/75   Lab Results  Component Value Date   CREATININE 0.64 07/12/2013   CREATININE 0.7 03/28/2010   Prediabetes  Judy Weaver has a diagnosis of prediabetes that started approximately six months to one year ago, and she was prescribed metformin. Hgb  A1c recent is unknown. She is attempting to work on diet and exercise to decrease her risk of diabetes.   Other hyperlipidemia Judy Weaver has hyperlipidemia and she is on Rosuvastatin (Crestor) 10 mg. She is attempting to improve her cholesterol levels with intensive lifestyle modification including a low saturated  fat diet, exercise and weight loss. She denies myalgias.  Vitamin D deficiency  Judy Weaver is on 2,000 IU of vitamin D daily. She admits fatigue and denies nausea, vomiting or muscle weakness.  OSA (obstructive sleep apnea) Zhana had a sleep study done and she needs to wear a mouthpiece daily, but she is not 100% compliant..   Depression with anxiety Judy Weaver struggles with emotional eating and using food for comfort to the extent that it is negatively impacting her health. She is emotional eating three to five times a week. Judy Weaver is on Celexa, Wellbutrin and Lamictal. She shows no sign of suicidal or homicidal ideations.  At risk for diabetes mellitus Judy Weaver is at higher than average risk for developing diabetes due to her obesity and prediabetes.   Assessment/Plan:   Other fatigue  Ammy does feel that her weight is causing her energy to be lower than it should be. Fatigue may be related to obesity, depression or many other causes. Labs and EKG will be ordered, and in the meanwhile, Marlaina will focus on self care including making healthy food choices, increasing physical activity and focusing on stress reduction.  SOB (shortness of breath) on exertion Shareena does feel that she gets out of breath more easily that she used to when she exercises. Shandie's shortness of breath appears to be obesity related and exercise induced. She has agreed to work on weight loss and gradually increase exercise to treat her exercise induced shortness of breath. Labs, EKG and Indirect Calorimetry will be ordered today. We will continue to monitor closely.  Essential hypertension  EKG, CMP and fasting lipid panel will be ordered today. Judy Weaver is working on healthy weight loss and exercise to improve blood pressure control. We will watch for signs of hypotension as she continues her lifestyle modifications.  Prediabetes  Judy Weaver will begin to work on weight loss, exercise, and decreasing simple carbohydrates to help decrease  the risk of diabetes. We will check Hgb A1c and insulin level today.  Other hyperlipidemia We will check fasting lipid panel today and Judy Weaver will begin to work on diet, exercise and weight loss efforts. Orders and follow up as documented in patient record.   Vitamin D deficiency  Low Vitamin D level contributes to fatigue and are associated with obesity, breast, and colon cancer. Judy Weaver will continue to take Vitamin D _0 ,000 IU daily and we will check vitamin D level today. She will follow-up for routine testing of Vitamin D, at least 2-3 times per year to avoid over-replacement.  OSA (obstructive sleep apnea) Judy Weaver will follow up with the sleep doctor as needed. Mouthpiece usage was encouraged.  Depression with anxiety  We will refer patient to Dr. Mallie Mussel for emotional eating. We will also refer patient to [facility] on [address] for psychiatry. Orders and follow up as documented in patient record.   At risk for diabetes mellitus Judy Weaver was given approximately 15 minutes of diabetes education and counseling today. We discussed intensive lifestyle modifications today with an emphasis on weight loss as well as increasing exercise and decreasing simple carbohydrates in her diet. We also reviewed medication options with an emphasis on risk versus benefit of those discussed.  Repetitive spaced learning was employed today to elicit superior memory formation and behavioral change.  Class 1 obesity with serious comorbidity and body mass index (BMI) of 34.0 to 34.9 in adult, unspecified obesity type Judy Weaver is currently in the action stage of change and her goal is to continue with weight loss efforts. I recommend Judy Weaver begin the structured treatment plan as follows:  She has agreed to the Category 3 Plan and keeping a food journal and adhering to recommended goals of 450 to 600 calories and 40+ grams of protein at supper daily.  Behavioral modification strategies: increasing lean  protein intake, increasing vegetables, meal planning and cooking strategies, keeping healthy foods in the home and planning for success.  She was informed of the importance of frequent follow-up visits to maximize her success with intensive lifestyle modifications for her multiple health conditions. She was informed we would discuss her lab results at her next visit unless there is a critical issue that needs to be addressed sooner. Judy Weaver agreed to keep her next visit at the agreed upon time to discuss these results.  Objective:   Blood pressure 106/64, pulse 93, temperature 98.2 F (36.8 C), temperature source Oral, height _0  (1.549 m), weight 180 lb (81.6 kg), SpO2 94 %. Body mass index is 34.01 kg/m.  EKG: Normal sinus rhythm, rate 93 BPM.  Indirect Calorimeter completed today shows a VO2 of 308 and a REE of 2143.  Her calculated basal metabolic rate is 2951 thus her basal metabolic rate is better than expected.  General: Cooperative, alert, well developed, in no acute distress. HEENT: Conjunctivae and lids unremarkable. Cardiovascular: Regular rhythm.  Lungs: Normal work of breathing. Musculoskeletal: 2+ pitting edema bilateral lower extremities Neurologic: No focal deficits.   Lab Results  Component Value Date   CREATININE 0.64 07/12/2013   BUN 15 07/12/2013   NA 137 07/12/2013   K 4.2 07/12/2013   CL 98 07/12/2013   CO2 29 07/12/2013   No results found for: ALT, AST, GGT, ALKPHOS, BILITOT No results found for: HGBA1C No results found for: INSULIN No results found for: TSH No results found for: CHOL, HDL, LDLCALC, LDLDIRECT, TRIG, CHOLHDL Lab Results  Component Value Date   WBC 8.0 07/12/2013   HGB 12.5 07/12/2013   HCT 35.7 (L) 07/12/2013   MCV 89.5 07/12/2013   PLT 272 07/12/2013   No results found for: IRON, TIBC, FERRITIN  Attestation Statements:   This is the patient's first visit at [facility]. The patient's NEW PATIENT PACKET was reviewed  at length. Included in the packet: current and past health history, medications, allergies, ROS, gynecologic history (women only), surgical history, family history, social history, weight history, weight loss surgery history (for those that have had weight loss surgery), nutritional evaluation, mood and food questionnaire, PHQ9, Epworth questionnaire, sleep habits questionnaire, patient life and health improvement goals questionnaire. These will all be scanned into the patient's chart under media.   During the visit, I independently reviewed the patient's EKG, bioimpedance scale results, and indirect calorimeter results. I used this information to tailor a meal plan for the patient that will help her to lose weight and will improve her obesity-related conditions going forward. I performed a medically necessary appropriate examination and/or evaluation. I discussed the assessment and treatment plan with the patient. The patient was provided an opportunity to ask questions and all were answered. The patient agreed with the plan and demonstrated an understanding of the instructions. Labs were ordered at this  visit and will be reviewed at the next visit unless more critical results need to be addressed immediately. Clinical information was updated and documented in the EMR.   Time spent on visit including pre-visit chart review and post-visit care was 45 minutes.   A separate 15 minutes was spent on risk counseling (see above).    I, Doreene Nest, am acting as transcriptionist for Coralie Common, MD.  I have reviewed the above documentation for accuracy and completeness, and I agree with the above. - Ilene Qua, MD

## 2019-05-16 LAB — COMPREHENSIVE METABOLIC PANEL
ALT: 23 IU/L (ref 0–32)
AST: 19 IU/L (ref 0–40)
Albumin/Globulin Ratio: 2.2 (ref 1.2–2.2)
Albumin: 4.6 g/dL (ref 3.8–4.9)
Alkaline Phosphatase: 79 IU/L (ref 39–117)
BUN/Creatinine Ratio: 17 (ref 9–23)
BUN: 13 mg/dL (ref 6–24)
Bilirubin Total: 0.2 mg/dL (ref 0.0–1.2)
CO2: 21 mmol/L (ref 20–29)
Calcium: 9.3 mg/dL (ref 8.7–10.2)
Chloride: 103 mmol/L (ref 96–106)
Creatinine, Ser: 0.75 mg/dL (ref 0.57–1.00)
GFR calc Af Amer: 107 mL/min/{1.73_m2} (ref >59)
GFR calc non Af Amer: 93 mL/min/{1.73_m2} (ref >59)
Globulin, Total: 2.1 g/dL (ref 1.5–4.5)
Glucose: 96 mg/dL (ref 65–99)
Potassium: 4.6 mmol/L (ref 3.5–5.2)
Sodium: 140 mmol/L (ref 134–144)
Total Protein: 6.7 g/dL (ref 6.0–8.5)

## 2019-05-16 LAB — CBC WITH DIFFERENTIAL/PLATELET
Basophils Absolute: 0.1 10*3/uL (ref 0.0–0.2)
Basos: 1 %
EOS (ABSOLUTE): 0.1 10*3/uL (ref 0.0–0.4)
Eos: 1 %
Hematocrit: 41.2 % (ref 34.0–46.6)
Hemoglobin: 13.8 g/dL (ref 11.1–15.9)
Immature Grans (Abs): 0 10*3/uL (ref 0.0–0.1)
Immature Granulocytes: 0 %
Lymphocytes Absolute: 2.7 10*3/uL (ref 0.7–3.1)
Lymphs: 34 %
MCH: 30.2 pg (ref 26.6–33.0)
MCHC: 33.5 g/dL (ref 31.5–35.7)
MCV: 90 fL (ref 79–97)
Monocytes Absolute: 0.5 10*3/uL (ref 0.1–0.9)
Monocytes: 7 %
Neutrophils Absolute: 4.6 10*3/uL (ref 1.4–7.0)
Neutrophils: 57 %
Platelets: 323 10*3/uL (ref 150–450)
RBC: 4.57 x10E6/uL (ref 3.77–5.28)
RDW: 13.1 % (ref 11.7–15.4)
WBC: 8 10*3/uL (ref 3.4–10.8)

## 2019-05-16 LAB — INSULIN, RANDOM: INSULIN: 18.8 u[IU]/mL (ref 2.6–24.9)

## 2019-05-16 LAB — LIPID PANEL WITH LDL/HDL RATIO
Cholesterol, Total: 160 mg/dL (ref 100–199)
HDL: 50 mg/dL (ref >39)
LDL Chol Calc (NIH): 93 mg/dL (ref 0–99)
LDL/HDL Ratio: 1.9 ratio (ref 0.0–3.2)
Triglycerides: 90 mg/dL (ref 0–149)
VLDL Cholesterol Cal: 17 mg/dL (ref 5–40)

## 2019-05-16 LAB — VITAMIN D 25 HYDROXY (VIT D DEFICIENCY, FRACTURES): Vit D, 25-Hydroxy: 44.4 ng/mL (ref 30.0–100.0)

## 2019-05-16 LAB — T4, FREE: Free T4: 1.11 ng/dL (ref 0.82–1.77)

## 2019-05-16 LAB — T3: T3, Total: 133 ng/dL (ref 71–180)

## 2019-05-16 LAB — FOLATE: Folate: >20 ng/mL (ref >3.0)

## 2019-05-16 LAB — VITAMIN B12: Vitamin B-12: 722 pg/mL (ref 232–1245)

## 2019-05-16 LAB — HEMOGLOBIN A1C
Est. average glucose Bld gHb Est-mCnc: 120 mg/dL
Hgb A1c MFr Bld: 5.8 % — ABNORMAL HIGH (ref 4.8–5.6)

## 2019-05-16 LAB — TSH: TSH: 0.679 u[IU]/mL (ref 0.450–4.500)

## 2019-05-22 ENCOUNTER — Ambulatory Visit (INDEPENDENT_AMBULATORY_CARE_PROVIDER_SITE_OTHER): Payer: 59 | Admitting: Psychology

## 2019-05-22 ENCOUNTER — Other Ambulatory Visit: Payer: Self-pay

## 2019-05-22 DIAGNOSIS — F3289 Other specified depressive episodes: Secondary | ICD-10-CM | POA: Diagnosis not present

## 2019-05-22 NOTE — Progress Notes (Signed)
  Office: [phone]  /  Fax: [phone]    Date: June 04, 2019   Appointment Start Time: 2:38pm Duration: 33 minutes Provider: Glennie Isle, Psy.D. Type of Session: Individual Therapy  Location of Patient: Home Location of Provider: Provider's Home Type of Contact: Telepsychological Visit via [employer]  Session Content:This provider called Judy Weaver at 2:35pm to assist with connecting.The e-mail with the secure link was re-sent. As such, today's appointment was initiated 8 minutes late. Judy Weaver is a 54 y.o. female presenting via [place] for a follow-up appointment to address the previously established treatment goal of decreasing emotional eating. Today's appointment was a telepsychological visit due to COVID-19. Judy Weaver provided verbal consent for today's telepsychological appointment and she is aware she is responsible for securing confidentiality on her end of the session. Prior to proceeding with today's appointment, Judy Weaver's physical location at the time of this appointment was obtained as well a phone number she could be reached at in the event of technical difficulties. Judy Weaver and this provider participated in today's telepsychological service.   This provider conducted a brief check-in. Judy Weaver shared about recent events, noting she has "been trying to follow the plan." She further shared her mother is dealing with health concerns, which she feels is contributing to difficulties with eating. Psychoeducation regarding all or nothing thinking was provided. Additionally, psychoeducation regarding SMART goals was provided to help increase eating congruent to the meal plan while dealing with ongoing stressors. The following goal was established: Judy Weaver will eat dinner congruent to her meal plan 3 out of 7 days between now and the next appointment with this provider. Barriers that may prevent her from meeting the aforementioned goal were explored. Overall, Judy Weaver was receptive to today's appointment  as evidenced by openness to sharing, responsiveness to feedback, and willingness to work toward the established goal.  Mental Status Examination:  Appearance: well groomed and appropriate hygiene  Behavior: appropriate to circumstances Mood: euthymic Affect: mood congruent Speech: normal in rate, volume, and tone Eye Contact: appropriate Psychomotor Activity: appropriate Gait: unable to assess Thought Process: linear, logical, and goal directed  Thought Content/Perception: no hallucinations, delusions, bizarre thinking or behavior reported or observed and no evidence of suicidal and homicidal ideation, plan, and intent Orientation: time, person, place and purpose of appointment Memory/Concentration: memory, attention, language, and fund of knowledge intact  Insight/Judgment: good  Interventions:  Conducted a brief chart review Provided empathic reflections and validation Employed supportive psychotherapy interventions to facilitate reduced distress and to improve coping skills with identified stressors Employed motivational interviewing skills to assess patient's willingness/desire to adhere to recommended medical treatments and assignments Engaged patient in goal setting Psychoeducation provided regarding SMART goals  Psychoeducation provided regarding all or nothing thinking  DSM-5 Diagnosis(es): 311 (F32.8) Other Specified Depressive Disorder, Emotional Eating Behaviors  Treatment Goal & Progress: During the initial appointment with this provider, the following treatment goal was established: decrease emotional eating. Progress is limited, as Judy Weaver has just begun treatment with this provider; however, she is receptive to the interaction and interventions and rapport is being established.   Plan: The next appointment will be scheduled in approximately two weeks, which will be via [employer]. The next session will focus on working towards the established treatment goal.

## 2019-05-29 ENCOUNTER — Encounter (INDEPENDENT_AMBULATORY_CARE_PROVIDER_SITE_OTHER): Payer: Self-pay | Admitting: Family Medicine

## 2019-05-29 ENCOUNTER — Ambulatory Visit (INDEPENDENT_AMBULATORY_CARE_PROVIDER_SITE_OTHER): Payer: 59 | Admitting: Family Medicine

## 2019-05-29 ENCOUNTER — Other Ambulatory Visit: Payer: Self-pay

## 2019-05-29 VITALS — BP 122/69 | HR 103 | Temp 98.0°F | Ht 61.0 in | Wt 179.0 lb

## 2019-05-29 DIAGNOSIS — R7303 Prediabetes: Secondary | ICD-10-CM | POA: Diagnosis not present

## 2019-05-29 DIAGNOSIS — Z9189 Other specified personal risk factors, not elsewhere classified: Secondary | ICD-10-CM

## 2019-05-29 DIAGNOSIS — Z6834 Body mass index (BMI) 34.0-34.9, adult: Secondary | ICD-10-CM

## 2019-05-29 DIAGNOSIS — E7849 Other hyperlipidemia: Secondary | ICD-10-CM

## 2019-05-29 DIAGNOSIS — E559 Vitamin D deficiency, unspecified: Secondary | ICD-10-CM | POA: Diagnosis not present

## 2019-05-29 DIAGNOSIS — E669 Obesity, unspecified: Secondary | ICD-10-CM

## 2019-05-30 NOTE — Progress Notes (Signed)
Chief Complaint:   OBESITY Judy Weaver is here to discuss her progress with her obesity treatment plan along with follow-up of her obesity related diagnoses. Cystal is on the Category 3 Plan and keeping a food journal and adhering to recommended goals of 450-600 calories and 40+ grams of protein at supper daily and states she is following her eating plan approximately 20% of the time. Nickayla states she is doing 0 minutes 0 times per week.  Today's visit was #: 2 Starting weight: 180 lbs Starting date: 05/15/2019 Today's weight: 179 lbs Today's date: 05/29/2019 Total lbs lost to date: 1 Total lbs lost since last in-office visit: 1  Interim History: Judy Weaver has had a busy few weeks and has not been as on track as she would have liked. She isn't sure if she is ready to commit to a meal plan.  Subjective:   1. Pre-diabetes Judy Weaver's Hgb A1c is 5.8 and insulin is 18.8. she has a prescription for metformin, but she hasn't started it. I discussed labs with the patient today.  2. Vitamin D deficiency Judy Weaver is on OTC Vit D 2,000 IU daily, and she notes fatigue. I discussed labs with the patient today.  3. Other hyperlipidemia Judy Weaver is on Crestor 10 mg daily. Her cholesterol levels are well controlled. I discussed labs with the patient today.  4. At risk for diabetes mellitus Judy Weaver is at higher than average risk for developing diabetes due to her obesity.   Assessment/Plan:   1. Pre-diabetes Judy Weaver will continue to work on weight loss, exercise, and decreasing simple carbohydrates to help decrease the risk of diabetes. Judy Weaver is to start metformin (she is to inform me of dose at her next appointment).  2. Vitamin D deficiency Low Vitamin D level contributes to fatigue and are associated with obesity, breast, and colon cancer. Judy Weaver is to stop OTC Vit D 2,000 IU, and start OTC Vit D 5,000 IU daily replacement. She will follow-up for routine testing of Vitamin D, at least 2-3 times per year to avoid  over-replacement.  3. Other hyperlipidemia Cardiovascular risk and specific lipid/LDL goals reviewed. We discussed several lifestyle modifications today and Judy Weaver will continue to work on diet, exercise and weight loss efforts. We will follow up on FLP in 3 months. Orders and follow up as documented in patient record.   4. At risk for diabetes mellitus Judy Weaver was given approximately 30 minutes of diabetes education and counseling today. We discussed intensive lifestyle modifications today with an emphasis on weight loss as well as increasing exercise and decreasing simple carbohydrates in her diet. We also reviewed medication options with an emphasis on risk versus benefit of those discussed.   Repetitive spaced learning was employed today to elicit superior memory formation and behavioral change.  5. Class 1 obesity with serious comorbidity and body mass index (BMI) of 34.0 to 34.9 in adult, unspecified obesity type Judy Weaver is currently in the action stage of change. As such, her goal is to continue with weight loss efforts. She has agreed to the Category 3 Plan.   Exercise goals: No exercise has been prescribed at this time.  Behavioral modification strategies: increasing lean protein intake, increasing vegetables, meal planning and cooking strategies, keeping healthy foods in the home and planning for success.  Judy Weaver has agreed to follow-up with our clinic in 2 weeks. She was informed of the importance of frequent follow-up visits to maximize her success with intensive lifestyle modifications for her multiple health conditions.  Objective:   Blood pressure 122/69, pulse (!) 103, temperature 98 F (36.7 C), temperature source Oral, height 5\' 1"  (1.549 m), weight 179 lb (81.2 kg), SpO2 94 %. Body mass index is 33.82 kg/m.  General: Cooperative, alert, well developed, in no acute distress. HEENT: Conjunctivae and lids unremarkable. Cardiovascular: Regular rhythm.  Lungs: Normal work of  breathing. Neurologic: No focal deficits.   Lab Results  Component Value Date   CREATININE 0.75 05/15/2019   BUN 13 05/15/2019   NA 140 05/15/2019   K 4.6 05/15/2019   CL 103 05/15/2019   CO2 21 05/15/2019   Lab Results  Component Value Date   ALT 23 05/15/2019   AST 19 05/15/2019   ALKPHOS 79 05/15/2019   BILITOT 0.2 05/15/2019   Lab Results  Component Value Date   HGBA1C 5.8 (H) 05/15/2019   Lab Results  Component Value Date   INSULIN 18.8 05/15/2019   Lab Results  Component Value Date   TSH 0.679 05/15/2019   Lab Results  Component Value Date   CHOL 160 05/15/2019   HDL 50 05/15/2019   LDLCALC 93 05/15/2019   TRIG 90 05/15/2019   Lab Results  Component Value Date   WBC 8.0 05/15/2019   HGB 13.8 05/15/2019   HCT 41.2 05/15/2019   MCV 90 05/15/2019   PLT 323 05/15/2019   No results found for: IRON, TIBC, FERRITIN  Attestation Statements:   Reviewed by clinician on day of visit: allergies, medications, problem list, medical history, surgical history, family history, social history, and previous encounter notes.   I, Trixie Dredge, am acting as transcriptionist for Ilene Qua, MD.  I have reviewed the above documentation for accuracy and completeness, and I agree with the above. - Ilene Qua, MD

## 2019-06-04 ENCOUNTER — Ambulatory Visit (INDEPENDENT_AMBULATORY_CARE_PROVIDER_SITE_OTHER): Payer: 59 | Admitting: Psychology

## 2019-06-04 ENCOUNTER — Other Ambulatory Visit: Payer: Self-pay

## 2019-06-04 DIAGNOSIS — F3289 Other specified depressive episodes: Secondary | ICD-10-CM

## 2019-06-04 NOTE — Progress Notes (Signed)
Office: [phone]  /  Fax: [phone]    Date: June 17, 2019   Appointment Start Time: 2:30pm Duration: 26 minutes Provider: Glennie Isle, Psy.D. Type of Session: Individual Therapy  Location of Patient: Home Location of Provider: Provider's Home Type of Contact: Telepsychological Visit via [employer] Web[employer]  Session Content: Judy Weaver is a 54 y.o. female presenting via [employer] for a follow-up appointment to address the previously established treatment goal of decreasing emotional eating. Today's appointment was a telepsychological visit due to COVID-19. Judy Weaver provided verbal consent for today's telepsychological appointment and she is aware she is responsible for securing confidentiality on her end of the session. Prior to proceeding with today's appointment, Judy Weaver's physical location at the time of this appointment was obtained as well a phone number she could be reached at in the event of technical difficulties. Judy Weaver and this provider participated in today's telepsychological service.   This provider conducted a brief check-in. Judy Weaver reported she changed the SMART goal previously established from dinner to lunch. She reported she ate lunch congruent to her meal plan "at least 3 to 4 times a week." Judy Weaver was receptive to increasing that goal (4 or 5 days out of 7 days a week). She described feeling stuck with her eating habits. This was further explored further and processed using hypothetical scenarios. She acknowledged engaging in comfort eating to assist with coping with ongoing stressors (i.e., worry about her mother's well-being). As such, psychoeducation regarding the importance of self-care utilizing the oxygen mask metaphor was provided. Psychoeducation regarding pleasurable activities, including its impact on emotional eating and overall well-being was also provided. Judy Weaver was provided with a handout with various options of pleasurable activities, and was encouraged to engage in one  activity a day and additional activities as needed when triggered to emotionally eat. Judy Weaver agreed. Judy Weaver provided verbal consent during today's appointment for this provider to send a handout about pleasurable activities via e-mail. Overall, Judy Weaver was receptive to today's appointment as evidenced by openness to sharing, responsiveness to feedback, willingness to engage in pleasurable activities to assist with coping, and willingness to work toward the established SMART goal.  Mental Status Examination:  Appearance: well groomed and appropriate hygiene  Behavior: appropriate to circumstances Mood: euthymic Affect: mood congruent Speech: normal in rate, volume, and tone Eye Contact: appropriate Psychomotor Activity: appropriate Gait: unable to assess Thought Process: linear, logical, and goal directed  Thought Content/Perception: no hallucinations, delusions, bizarre thinking or behavior reported or observed and no evidence of suicidal and homicidal ideation, plan, and intent Orientation: time, person, place and purpose of appointment Memory/Concentration: memory, attention, language, and fund of knowledge intact  Insight/Judgment: good  Interventions:  Conducted a brief chart review Provided empathic reflections and validation Reviewed content from the previous session Employed supportive psychotherapy interventions to facilitate reduced distress and to improve coping skills with identified stressors Employed motivational interviewing skills to assess patient's willingness/desire to adhere to recommended medical treatments and assignments Engaged patient in goal setting Psychoeducation provided regarding pleasurable activities Psychoeducation provided regarding self-care  DSM-5 Diagnosis(es): 311 (F32.8) Other Specified Depressive Disorder, Emotional Eating Behaviors  Treatment Goal & Progress: During the initial appointment with this provider, the following treatment goal was  established: decrease emotional eating. Judy Weaver has demonstrated progress in her goal as evidenced by increased awareness of hunger patterns. Judy Weaver also demonstrates willingness to engage in pleasurable activities.  Plan: The next appointment will be scheduled in approximately two weeks, which will be via [employer]. The next session will  focus on working towards the established treatment goal.

## 2019-06-13 ENCOUNTER — Ambulatory Visit (INDEPENDENT_AMBULATORY_CARE_PROVIDER_SITE_OTHER): Payer: 59 | Admitting: Family Medicine

## 2019-06-13 ENCOUNTER — Other Ambulatory Visit: Payer: Self-pay

## 2019-06-13 ENCOUNTER — Encounter (INDEPENDENT_AMBULATORY_CARE_PROVIDER_SITE_OTHER): Payer: Self-pay | Admitting: Family Medicine

## 2019-06-13 VITALS — BP 101/66 | HR 101 | Temp 98.5°F | Ht 61.0 in | Wt 179.0 lb

## 2019-06-13 DIAGNOSIS — E669 Obesity, unspecified: Secondary | ICD-10-CM | POA: Diagnosis not present

## 2019-06-13 DIAGNOSIS — Z6834 Body mass index (BMI) 34.0-34.9, adult: Secondary | ICD-10-CM | POA: Diagnosis not present

## 2019-06-13 DIAGNOSIS — E559 Vitamin D deficiency, unspecified: Secondary | ICD-10-CM | POA: Diagnosis not present

## 2019-06-13 DIAGNOSIS — R7303 Prediabetes: Secondary | ICD-10-CM

## 2019-06-13 NOTE — Progress Notes (Signed)
Chief Complaint:   OBESITY Judy Weaver is here to discuss her progress with her obesity treatment plan along with follow-up of her obesity related diagnoses. Judy Weaver is on the Category 3 Plan and states she is following her eating plan approximately 50% of the time. Judy Weaver states she is exercising 0 minutes 0 times per week.  Today's visit was #: 3 Starting weight: 180 lbs Starting date: 05/15/2019 Today's weight: 179 lbs Today's date: 06/13/2019 Total lbs lost to date: 1 Total lbs lost since last in-office visit: 0  Interim History: Judy Weaver reports over the last few weeks she was half on and half off the meal plan. She is looking for other options for breakfast as she doesn't like yogurt. She finds dinner to be the most difficult meal to stay on plan for and reports she doesn't always eat all of her protein. She really hates cooking.  Subjective:   Prediabetes. Judy Weaver has a diagnosis of prediabetes based on her elevated HgA1c and was informed this puts her at greater risk of developing diabetes. She continues to work on diet and exercise to decrease her risk of diabetes. She denies nausea or hypoglycemia. Judy Weaver is on metformin and denies any GI side effects.  Lab Results  Component Value Date   HGBA1C 5.8 (H) 05/15/2019   Lab Results  Component Value Date   INSULIN 18.8 05/15/2019   Vitamin D deficiency. No nausea, vomiting, or muscle weakness. She endorses fatigue. Last Vitamin D 44.4 on 05/15/2019.  Assessment/Plan:   Prediabetes. Judy Weaver will continue to work on weight loss, exercise, and decreasing simple carbohydrates to help decrease the risk of diabetes. She will have repeat labs in early June.  Vitamin D deficiency. Low Vitamin D level contributes to fatigue and are associated with obesity, breast, and colon cancer. She agrees to continue to take Vitamin D (no refill needed) and will follow-up for routine testing of Vitamin D, at least 2-3 times per year to avoid  over-replacement.  Class 1 obesity with serious comorbidity and body mass index (BMI) of 34.0 to 34.9 in adult, unspecified obesity type.  Judy Weaver is currently in the action stage of change. As such, her goal is to continue with weight loss efforts. She has agreed to the Category 3 Plan and will journal 450-600 calories and 40+ grams of protein at supper.   Exercise goals: No exercise has been prescribed at this time.  Behavioral modification strategies: increasing lean protein intake, increasing vegetables, meal planning and cooking strategies, keeping healthy foods in the home and planning for success.  Judy Weaver has agreed to follow-up with our clinic in 2-3 weeks. She was informed of the importance of frequent follow-up visits to maximize her success with intensive lifestyle modifications for her multiple health conditions.   Objective:   Blood pressure 101/66, pulse (!) 101, temperature 98.5 F (36.9 C), temperature source Oral, height 5\' 1"  (1.549 m), weight 179 lb (81.2 kg), SpO2 94 %. Body mass index is 33.82 kg/m.  General: Cooperative, alert, well developed, in no acute distress. HEENT: Conjunctivae and lids unremarkable. Cardiovascular: Regular rhythm.  Lungs: Normal work of breathing. Neurologic: No focal deficits.   Lab Results  Component Value Date   CREATININE 0.75 05/15/2019   BUN 13 05/15/2019   NA 140 05/15/2019   K 4.6 05/15/2019   CL 103 05/15/2019   CO2 21 05/15/2019   Lab Results  Component Value Date   ALT 23 05/15/2019   AST 19 05/15/2019  ALKPHOS 79 05/15/2019   BILITOT 0.2 05/15/2019   Lab Results  Component Value Date   HGBA1C 5.8 (H) 05/15/2019   Lab Results  Component Value Date   INSULIN 18.8 05/15/2019   Lab Results  Component Value Date   TSH 0.679 05/15/2019   Lab Results  Component Value Date   CHOL 160 05/15/2019   HDL 50 05/15/2019   LDLCALC 93 05/15/2019   TRIG 90 05/15/2019   Lab Results  Component Value Date   WBC 8.0  05/15/2019   HGB 13.8 05/15/2019   HCT 41.2 05/15/2019   MCV 90 05/15/2019   PLT 323 05/15/2019   No results found for: IRON, TIBC, FERRITIN  Attestation Statements:   Reviewed by clinician on day of visit: allergies, medications, problem list, medical history, surgical history, family history, social history, and previous encounter notes.  Time spent on visit including pre-visit chart review and post-visit charting and care was 15 minutes.   I, Michaelene Song, am acting as transcriptionist for Coralie Common, MD  I have reviewed the above documentation for accuracy and completeness, and I agree with the above. - Ilene Qua, MD

## 2019-06-17 ENCOUNTER — Ambulatory Visit (INDEPENDENT_AMBULATORY_CARE_PROVIDER_SITE_OTHER): Payer: 59 | Admitting: Psychology

## 2019-06-17 ENCOUNTER — Other Ambulatory Visit: Payer: Self-pay

## 2019-06-17 DIAGNOSIS — F3289 Other specified depressive episodes: Secondary | ICD-10-CM

## 2019-06-18 NOTE — Progress Notes (Signed)
Office: [phone]  /  Fax: [phone]    Date: July 02, 2019   Appointment Start Time: 8:36am Duration: 25 minutes Provider: Glennie Isle, Psy.D. Type of Session: Individual Therapy  Location of Patient: Home Location of Provider: Provider's Home Type of Contact: Telepsychological Visit via Telephone Call  Session Content: This provider called Judy Weaver at 8:32am as she did not present for the telepsychological appointment. She indicated she was trying to join the meeting. This provider called Sallyjo again at 8:35am as she did not present on Webex. Due to difficulty connecting, Blyss provided verbal consent to proceed via a regular telephone call. As such, today's appointment was initiated 6 minutes late. Judy Weaver is a 54 y.o. female presenting via [employer] for a follow-up appointment to address the previously established treatment goal of decreasing emotional eating. Today's appointment was a telepsychological visit due to COVID-19. Judy Weaver provided verbal consent for today's telepsychological appointment and she is aware she is responsible for securing confidentiality on her end of the session. Prior to proceeding with today's appointment, Judy Weaver's physical location at the time of this appointment was obtained as well a phone number she could be reached at in the event of technical difficulties. Judy Weaver and this provider participated in today's telepsychological service.   This provider conducted a brief check-in. Judy Weaver shared about recent events, including deviations from the meal plan while [place] the beach. She also provided an update regarding her mother's well-being. Additionally, Judy Weaver reported ambivalence regarding her ability to "commit" to the clinic. This was further explored and associated thoughts and feelings were processed. She described frequently feeling overwhelmed. Thus, psychoeducation regarding a grounding technique (5-4-3-2-1) was provided to assist with coping. She was led  through the exercise. Judy Weaver provided verbal consent during today's appointment for this provider to send the handout for today's exercise via e-mail. Overall, Judy Weaver was receptive to today's appointment as evidenced by openness to sharing and responsiveness to feedback.  Mental Status Examination:  Appearance: unable to assess  Behavior: unable to assess Mood: anxious Affect: unable to fully assess Speech: normal in rate, volume, and tone Eye Contact: unable to assess Psychomotor Activity: unable to assess  Gait: unable to assess Thought Process: linear, logical, and goal directed  Thought Content/Perception: no hallucinations, delusions, bizarre thinking or behavior reported or observed and no evidence of suicidal and homicidal ideation, plan, and intent Orientation: time, person, place and purpose of appointment Memory/Concentration: memory, attention, language, and fund of knowledge intact  Insight/Judgment: good  Interventions:  Conducted a brief chart review Provided empathic reflections and validation Employed supportive psychotherapy interventions to facilitate reduced distress and to improve coping skills with identified stressors Employed motivational interviewing skills to assess patient's willingness/desire to adhere to recommended medical treatments and assignments Psychoeducation provided regarding grounding exercises Engaged patient in grounding exercise  DSM-5 Diagnosis(es): 311 (F32.8) Other Specified Depressive Disorder, Emotional Eating Behaviors  Treatment Goal & Progress: During the initial appointment with this provider, the following treatment goal was established: decrease emotional eating. Judy Weaver has demonstrated progress in her goal as evidenced by increased awareness of hunger patterns.   Plan: Judy Weaver declined future appointments with this provider due to ongoing stressors and appointments. She acknowledged understanding that she may request a follow-up appointment  with this provider in the future as long as she is still established with the clinic. Judy Weaver was encouraged to continue meeting with her other therapist. She was receptive and reported her next appointment with her other therapist is next week. No further follow-up planned  by this provider.

## 2019-07-02 ENCOUNTER — Ambulatory Visit (INDEPENDENT_AMBULATORY_CARE_PROVIDER_SITE_OTHER): Payer: 59 | Admitting: Psychology

## 2019-07-02 ENCOUNTER — Ambulatory Visit (INDEPENDENT_AMBULATORY_CARE_PROVIDER_SITE_OTHER): Payer: 59 | Admitting: Physician Assistant

## 2019-07-02 ENCOUNTER — Encounter (INDEPENDENT_AMBULATORY_CARE_PROVIDER_SITE_OTHER): Payer: Self-pay | Admitting: Physician Assistant

## 2019-07-02 ENCOUNTER — Other Ambulatory Visit: Payer: Self-pay

## 2019-07-02 VITALS — BP 103/67 | HR 107 | Temp 98.2°F | Ht 61.0 in | Wt 180.0 lb

## 2019-07-02 DIAGNOSIS — Z6834 Body mass index (BMI) 34.0-34.9, adult: Secondary | ICD-10-CM | POA: Diagnosis not present

## 2019-07-02 DIAGNOSIS — E7849 Other hyperlipidemia: Secondary | ICD-10-CM | POA: Diagnosis not present

## 2019-07-02 DIAGNOSIS — E669 Obesity, unspecified: Secondary | ICD-10-CM

## 2019-07-02 DIAGNOSIS — F3289 Other specified depressive episodes: Secondary | ICD-10-CM | POA: Diagnosis not present

## 2019-07-03 NOTE — Progress Notes (Signed)
Chief Complaint:   OBESITY Judy Weaver is here to discuss her progress with her obesity treatment plan along with follow-up of her obesity related diagnoses. Judy Weaver is on the Category 3 Plan and states she is following her eating plan approximately 30% of the time. Judy Weaver states she is exercising 0 minutes 0 times per week.  Today's visit was #: 4 Starting weight: 180 lbs Starting date: 05/15/2019 Today's weight: 180 lbs Today's date: 07/02/2019 Total lbs lost to date: 0 Total lbs lost since last in-office visit: 0  Interim History: Judy Weaver reports she struggles with the plan because she would rather skip breakfast and does not like to cook. She eats out for breakfast every Saturday morning.  Subjective:   Other hyperlipidemia. Ashonti is on Crestor. No chest pain. No myalgias.   Lab Results  Component Value Date   CHOL 160 05/15/2019   HDL 50 05/15/2019   LDLCALC 93 05/15/2019   TRIG 90 05/15/2019   Lab Results  Component Value Date   ALT 23 05/15/2019   AST 19 05/15/2019   ALKPHOS 79 05/15/2019   BILITOT 0.2 05/15/2019   The 10-year ASCVD risk score Mikey Bussing DC Jr., et al., 2013) is: 1%   Values used to calculate the score:     Age: 54 years     Sex: Female     Is Non-Hispanic African American: No     Diabetic: No     Tobacco smoker: No     Systolic Blood Pressure: [id number] mmHg     Is BP treated: Yes     HDL Cholesterol: 50 mg/dL     Total Cholesterol: 160 mg/dL  Assessment/Plan:   Other hyperlipidemia. Cardiovascular risk and specific lipid/LDL goals reviewed.  We discussed several lifestyle modifications today and Judy Weaver will continue to work on diet, exercise and weight loss efforts. Orders and follow up as documented in patient record. Judy Weaver will continue her medication as directed.  Counseling Intensive lifestyle modifications are the first line treatment for this issue. . Dietary changes: Increase soluble fiber. Decrease simple  carbohydrates. . Exercise changes: Moderate to vigorous-intensity aerobic activity 150 minutes per week if tolerated. . Lipid-lowering medications: see documented in medical record.  Class 1 obesity with serious comorbidity and body mass index (BMI) of 34.0 to 34.9 in adult, unspecified obesity type.  Judy Weaver is currently in the action stage of change. As such, her goal is to continue with weight loss efforts. She has agreed to the Category 3 Plan.   Exercise goals: For substantial health benefits, adults should do at least 150 minutes (2 hours and 30 minutes) a week of moderate-intensity, or 75 minutes (1 hour and 15 minutes) a week of vigorous-intensity aerobic physical activity, or an equivalent combination of moderate- and vigorous-intensity aerobic activity. Aerobic activity should be performed in episodes of at least 10 minutes, and preferably, it should be spread throughout the week.  Behavioral modification strategies: meal planning and cooking strategies and keeping healthy foods in the home.  Judy Weaver has agreed to follow-up with our clinic in 2 weeks. She was informed of the importance of frequent follow-up visits to maximize her success with intensive lifestyle modifications for her multiple health conditions.   Objective:   Blood pressure 103/67, pulse (!) 107, temperature 98.2 F (36.8 C), temperature source Oral, height 5\' 1"  (1.549 m), weight 180 lb (81.6 kg), SpO2 95 %. Body mass index is 34.01 kg/m.  General: Cooperative, alert, well developed, in no acute distress.  HEENT: Conjunctivae and lids unremarkable. Cardiovascular: Regular rhythm.  Lungs: Normal work of breathing. Neurologic: No focal deficits.   Lab Results  Component Value Date   CREATININE 0.75 05/15/2019   BUN 13 05/15/2019   NA 140 05/15/2019   K 4.6 05/15/2019   CL 103 05/15/2019   CO2 21 05/15/2019   Lab Results  Component Value Date   ALT 23 05/15/2019   AST 19 05/15/2019   ALKPHOS 79 05/15/2019    BILITOT 0.2 05/15/2019   Lab Results  Component Value Date   HGBA1C 5.8 (H) 05/15/2019   Lab Results  Component Value Date   INSULIN 18.8 05/15/2019   Lab Results  Component Value Date   TSH 0.679 05/15/2019   Lab Results  Component Value Date   CHOL 160 05/15/2019   HDL 50 05/15/2019   LDLCALC 93 05/15/2019   TRIG 90 05/15/2019   Lab Results  Component Value Date   WBC 8.0 05/15/2019   HGB 13.8 05/15/2019   HCT 41.2 05/15/2019   MCV 90 05/15/2019   PLT 323 05/15/2019   No results found for: IRON, TIBC, FERRITIN  Attestation Statements:   Reviewed by clinician on day of visit: allergies, medications, problem list, medical history, surgical history, family history, social history, and previous encounter notes.  Time spent on visit including pre-visit chart review and post-visit charting and care was 32 minutes.   IMichaelene Song, am acting as transcriptionist for Abby Potash, PA-C   I have reviewed the above documentation for accuracy and completeness, and I agree with the above. Abby Potash, PA-C

## 2019-07-22 ENCOUNTER — Encounter (INDEPENDENT_AMBULATORY_CARE_PROVIDER_SITE_OTHER): Payer: Self-pay | Admitting: Physician Assistant

## 2019-07-22 ENCOUNTER — Ambulatory Visit (INDEPENDENT_AMBULATORY_CARE_PROVIDER_SITE_OTHER): Payer: 59 | Admitting: Physician Assistant

## 2019-07-22 ENCOUNTER — Other Ambulatory Visit: Payer: Self-pay

## 2019-07-22 VITALS — BP 103/67 | HR 91 | Temp 98.2°F | Ht 61.0 in | Wt 179.0 lb

## 2019-07-22 DIAGNOSIS — R7303 Prediabetes: Secondary | ICD-10-CM | POA: Diagnosis not present

## 2019-07-22 DIAGNOSIS — Z6833 Body mass index (BMI) 33.0-33.9, adult: Secondary | ICD-10-CM

## 2019-07-22 DIAGNOSIS — E669 Obesity, unspecified: Secondary | ICD-10-CM | POA: Diagnosis not present

## 2019-07-23 NOTE — Progress Notes (Signed)
Chief Complaint:   OBESITY Pierrette Alsop is here to discuss her progress with her obesity treatment plan along with follow-up of her obesity related diagnoses. Oaklie is on the Category 3 Plan and states she is following her eating plan approximately 50% of the time. Grisel states she is exercising 0 minutes 0 times per week.  Today's visit was #: 5 Starting weight: 180 lbs Starting date: 05/15/2019 Today's weight: 179 lbs Today's date: 07/22/2019 Total lbs lost to date: 1 Total lbs lost since last in-office visit: 1  Interim History: Kayse reports that she did not skip breakfast as often the past 2 weeks. She was in [place] for a few days and didn't fully eat on plan during that time.  Subjective:   Prediabetes. Janeeka has a diagnosis of prediabetes based on her elevated HgA1c and was informed this puts her at greater risk of developing diabetes. She continues to work on diet and exercise to decrease her risk of diabetes. She denies nausea, vomiting, or diarrhea. No polyphagia. Ethleen is on metformin.  Lab Results  Component Value Date   HGBA1C 5.8 (H) 05/15/2019   Lab Results  Component Value Date   INSULIN 18.8 05/15/2019   Assessment/Plan:   Prediabetes. Jadlyn will continue to work on weight loss, exercise, and decreasing simple carbohydrates to help decrease the risk of diabetes. She will continue her metformin as directed.  Class 1 obesity with serious comorbidity and body mass index (BMI) of 33.0 to 33.9 in adult, unspecified obesity type.  Faeryn is currently in the action stage of change. As such, her goal is to continue with weight loss efforts. She has agreed to the Category 3 Plan.   Exercise goals: Tesia will start walking.  Behavioral modification strategies: meal planning and cooking strategies and keeping healthy foods in the home.  Daizie has agreed to follow-up with our clinic in 3 weeks. She was informed of the importance of frequent follow-up  visits to maximize her success with intensive lifestyle modifications for her multiple health conditions.   Objective:   Blood pressure 103/67, pulse 91, temperature 98.2 F (36.8 C), temperature source Oral, height 5\' 1"  (1.549 m), weight 179 lb (81.2 kg), SpO2 96 %. Body mass index is 33.82 kg/m.  General: Cooperative, alert, well developed, in no acute distress. HEENT: Conjunctivae and lids unremarkable. Cardiovascular: Regular rhythm.  Lungs: Normal work of breathing. Neurologic: No focal deficits.   Lab Results  Component Value Date   CREATININE 0.75 05/15/2019   BUN 13 05/15/2019   NA 140 05/15/2019   K 4.6 05/15/2019   CL 103 05/15/2019   CO2 21 05/15/2019   Lab Results  Component Value Date   ALT 23 05/15/2019   AST 19 05/15/2019   ALKPHOS 79 05/15/2019   BILITOT 0.2 05/15/2019   Lab Results  Component Value Date   HGBA1C 5.8 (H) 05/15/2019   Lab Results  Component Value Date   INSULIN 18.8 05/15/2019   Lab Results  Component Value Date   TSH 0.679 05/15/2019   Lab Results  Component Value Date   CHOL 160 05/15/2019   HDL 50 05/15/2019   LDLCALC 93 05/15/2019   TRIG 90 05/15/2019   Lab Results  Component Value Date   WBC 8.0 05/15/2019   HGB 13.8 05/15/2019   HCT 41.2 05/15/2019   MCV 90 05/15/2019   PLT 323 05/15/2019   No results found for: IRON, TIBC, FERRITIN  Attestation Statements:   Reviewed by  clinician on day of visit: allergies, medications, problem list, medical history, surgical history, family history, social history, and previous encounter notes.  Time spent on visit including pre-visit chart review and post-visit charting and care was 30 minutes.   IMichaelene Song, am acting as transcriptionist for Abby Potash, PA-C   I have reviewed the above documentation for accuracy and completeness, and I agree with the above. Abby Potash, PA-C

## 2019-08-13 ENCOUNTER — Ambulatory Visit (INDEPENDENT_AMBULATORY_CARE_PROVIDER_SITE_OTHER): Payer: 59 | Admitting: Physician Assistant

## 2020-03-02 ENCOUNTER — Other Ambulatory Visit: Payer: Self-pay | Admitting: Family Medicine

## 2020-03-02 DIAGNOSIS — Z1231 Encounter for screening mammogram for malignant neoplasm of breast: Secondary | ICD-10-CM

## 2020-04-28 ENCOUNTER — Other Ambulatory Visit: Payer: Self-pay

## 2020-04-28 ENCOUNTER — Ambulatory Visit
Admission: RE | Admit: 2020-04-28 | Discharge: 2020-04-28 | Disposition: A | Discharge status: Home / Self Care | Payer: 59 | Source: Ambulatory Visit | Attending: Family Medicine | Admitting: Family Medicine

## 2020-04-28 ENCOUNTER — Ambulatory Visit: Payer: 59

## 2020-04-28 DIAGNOSIS — Z1231 Encounter for screening mammogram for malignant neoplasm of breast: Secondary | ICD-10-CM

## 2020-05-21 ENCOUNTER — Other Ambulatory Visit: Payer: Self-pay | Admitting: Family Medicine

## 2020-05-21 DIAGNOSIS — N63 Unspecified lump in unspecified breast: Secondary | ICD-10-CM

## 2020-06-10 ENCOUNTER — Ambulatory Visit
Admission: RE | Admit: 2020-06-10 | Discharge: 2020-06-10 | Disposition: A | Discharge status: Home / Self Care | Payer: 59 | Source: Ambulatory Visit | Attending: Family Medicine | Admitting: Family Medicine

## 2020-06-10 ENCOUNTER — Other Ambulatory Visit: Payer: Self-pay

## 2020-06-10 DIAGNOSIS — N63 Unspecified lump in unspecified breast: Secondary | ICD-10-CM

## 2020-07-24 ENCOUNTER — Emergency Department (HOSPITAL_BASED_OUTPATIENT_CLINIC_OR_DEPARTMENT_OTHER): Payer: 59

## 2020-07-24 ENCOUNTER — Ambulatory Visit (HOSPITAL_COMMUNITY)
Admission: AD | Admit: 2020-07-24 | Discharge: 2020-07-24 | Disposition: A | Discharge status: Home / Self Care | Payer: 59 | Source: Ambulatory Visit | Attending: Urology | Admitting: Urology

## 2020-07-24 ENCOUNTER — Encounter (HOSPITAL_COMMUNITY)
Admission: AD | Disposition: A | Discharge status: Home / Self Care | Payer: Self-pay | Source: Ambulatory Visit | Attending: Urology

## 2020-07-24 ENCOUNTER — Emergency Department (HOSPITAL_BASED_OUTPATIENT_CLINIC_OR_DEPARTMENT_OTHER)
Admission: EM | Admit: 2020-07-24 | Discharge: 2020-07-24 | Disposition: A | Discharge status: Home / Self Care | Payer: 59 | Source: Home / Self Care | Attending: Emergency Medicine | Admitting: Emergency Medicine

## 2020-07-24 ENCOUNTER — Inpatient Hospital Stay (HOSPITAL_COMMUNITY): Payer: 59 | Admitting: Anesthesiology

## 2020-07-24 ENCOUNTER — Inpatient Hospital Stay (HOSPITAL_COMMUNITY): Payer: 59

## 2020-07-24 ENCOUNTER — Encounter (HOSPITAL_BASED_OUTPATIENT_CLINIC_OR_DEPARTMENT_OTHER): Payer: Self-pay | Admitting: *Deleted

## 2020-07-24 ENCOUNTER — Encounter (HOSPITAL_COMMUNITY): Payer: Self-pay | Admitting: Urology

## 2020-07-24 ENCOUNTER — Other Ambulatory Visit: Payer: Self-pay

## 2020-07-24 ENCOUNTER — Other Ambulatory Visit: Payer: Self-pay | Admitting: Urology

## 2020-07-24 DIAGNOSIS — Z841 Family history of disorders of kidney and ureter: Secondary | ICD-10-CM | POA: Diagnosis not present

## 2020-07-24 DIAGNOSIS — Z7982 Long term (current) use of aspirin: Secondary | ICD-10-CM | POA: Insufficient documentation

## 2020-07-24 DIAGNOSIS — I10 Essential (primary) hypertension: Secondary | ICD-10-CM | POA: Insufficient documentation

## 2020-07-24 DIAGNOSIS — N132 Hydronephrosis with renal and ureteral calculous obstruction: Secondary | ICD-10-CM | POA: Insufficient documentation

## 2020-07-24 DIAGNOSIS — N201 Calculus of ureter: Secondary | ICD-10-CM

## 2020-07-24 DIAGNOSIS — Z79899 Other long term (current) drug therapy: Secondary | ICD-10-CM | POA: Insufficient documentation

## 2020-07-24 DIAGNOSIS — Z882 Allergy status to sulfonamides status: Secondary | ICD-10-CM | POA: Diagnosis not present

## 2020-07-24 DIAGNOSIS — Z7984 Long term (current) use of oral hypoglycemic drugs: Secondary | ICD-10-CM | POA: Diagnosis not present

## 2020-07-24 DIAGNOSIS — Z20822 Contact with and (suspected) exposure to covid-19: Secondary | ICD-10-CM | POA: Diagnosis not present

## 2020-07-24 DIAGNOSIS — N23 Unspecified renal colic: Secondary | ICD-10-CM

## 2020-07-24 HISTORY — PX: CYSTOSCOPY W/ URETERAL STENT PLACEMENT: SHX1429

## 2020-07-24 LAB — BASIC METABOLIC PANEL
Anion gap: 11 (ref 5–15)
BUN: 32 mg/dL — ABNORMAL HIGH (ref 6–20)
CO2: 25 mmol/L (ref 22–32)
Calcium: 9.4 mg/dL (ref 8.9–10.3)
Chloride: 103 mmol/L (ref 98–111)
Creatinine, Ser: 1.07 mg/dL — ABNORMAL HIGH (ref 0.44–1.00)
GFR, Estimated: >60 mL/min (ref >60)
Glucose, Bld: 140 mg/dL — ABNORMAL HIGH (ref 70–99)
Potassium: 4.1 mmol/L (ref 3.5–5.1)
Sodium: 139 mmol/L (ref 135–145)

## 2020-07-24 LAB — CBC WITH DIFFERENTIAL/PLATELET
Abs Immature Granulocytes: 0.07 10*3/uL (ref 0.00–0.07)
Basophils Absolute: 0.1 10*3/uL (ref 0.0–0.1)
Basophils Relative: 1 %
Eosinophils Absolute: 0.3 10*3/uL (ref 0.0–0.5)
Eosinophils Relative: 3 %
HCT: 39.1 % (ref 36.0–46.0)
Hemoglobin: 13.2 g/dL (ref 12.0–15.0)
Immature Granulocytes: 1 %
Lymphocytes Relative: 23 %
Lymphs Abs: 2.6 10*3/uL (ref 0.7–4.0)
MCH: 30.4 pg (ref 26.0–34.0)
MCHC: 33.8 g/dL (ref 30.0–36.0)
MCV: 90.1 fL (ref 80.0–100.0)
Monocytes Absolute: 0.8 10*3/uL (ref 0.1–1.0)
Monocytes Relative: 7 %
Neutro Abs: 7.7 10*3/uL (ref 1.7–7.7)
Neutrophils Relative %: 65 %
Platelets: 343 10*3/uL (ref 150–400)
RBC: 4.34 MIL/uL (ref 3.87–5.11)
RDW: 13.6 % (ref 11.5–15.5)
WBC: 11.6 10*3/uL — ABNORMAL HIGH (ref 4.0–10.5)
nRBC: 0 % (ref 0.0–0.2)

## 2020-07-24 LAB — URINALYSIS, ROUTINE W REFLEX MICROSCOPIC
Bilirubin Urine: NEGATIVE
Glucose, UA: NEGATIVE mg/dL
Ketones, ur: NEGATIVE mg/dL
Nitrite: NEGATIVE
Protein, ur: NEGATIVE mg/dL
Specific Gravity, Urine: >1.03 — ABNORMAL HIGH (ref 1.005–1.030)
pH: 5 (ref 5.0–8.0)

## 2020-07-24 LAB — URINALYSIS, MICROSCOPIC (REFLEX): RBC / HPF: >50 RBC/hpf (ref 0–5)

## 2020-07-24 LAB — GLUCOSE, CAPILLARY: Glucose-Capillary: 128 mg/dL — ABNORMAL HIGH (ref 70–99)

## 2020-07-24 LAB — SARS CORONAVIRUS 2 BY RT PCR (HOSPITAL ORDER, PERFORMED IN ~~LOC~~ HOSPITAL LAB): SARS Coronavirus 2: NEGATIVE

## 2020-07-24 SURGERY — CYSTOSCOPY, WITH RETROGRADE PYELOGRAM AND URETERAL STENT INSERTION
Anesthesia: General | Laterality: Right

## 2020-07-24 MED ORDER — MEPERIDINE HCL 50 MG/ML IJ SOLN: 6.2500 mg | INTRAMUSCULAR | Status: DC | PRN

## 2020-07-24 MED ORDER — OXYCODONE HCL 5 MG/5ML PO SOLN: 5.0000 mg | Freq: Once | ORAL | Status: DC | PRN

## 2020-07-24 MED ORDER — LIDOCAINE 2% (20 MG/ML) 5 ML SYRINGE
INTRAMUSCULAR | Status: AC
  Filled 2020-07-24: qty 5

## 2020-07-24 MED ORDER — OXYCODONE-ACETAMINOPHEN 5-325 MG PO TABS: 1.0000 | ORAL_TABLET | ORAL | 0 refills | Status: DC | PRN

## 2020-07-24 MED ORDER — ONDANSETRON 4 MG PO TBDP: ORAL_TABLET | ORAL | 0 refills | Status: AC

## 2020-07-24 MED ORDER — OXYCODONE HCL 5 MG PO TABS: 5.0000 mg | ORAL_TABLET | Freq: Once | ORAL | Status: DC | PRN

## 2020-07-24 MED ORDER — HYDROMORPHONE HCL 1 MG/ML IJ SOLN
INTRAMUSCULAR | Status: AC
  Filled 2020-07-24: qty 1

## 2020-07-24 MED ORDER — CHLORHEXIDINE GLUCONATE 0.12 % MT SOLN
15.0000 mL | Freq: Once | OROMUCOSAL | Status: AC
  Administered 2020-07-24: 15 mL via OROMUCOSAL

## 2020-07-24 MED ORDER — MIDAZOLAM HCL 2 MG/2ML IJ SOLN
INTRAMUSCULAR | Status: AC
  Filled 2020-07-24: qty 2

## 2020-07-24 MED ORDER — ONDANSETRON HCL 4 MG/2ML IJ SOLN
INTRAMUSCULAR | Status: DC | PRN
  Administered 2020-07-24: 4 mg via INTRAVENOUS

## 2020-07-24 MED ORDER — ONDANSETRON HCL 4 MG/2ML IJ SOLN
4.0000 mg | Freq: Once | INTRAMUSCULAR | Status: AC
  Administered 2020-07-24: 4 mg via INTRAVENOUS
  Filled 2020-07-24: qty 2

## 2020-07-24 MED ORDER — DOCUSATE SODIUM 100 MG PO CAPS: 100.0000 mg | ORAL_CAPSULE | Freq: Every day | ORAL | 0 refills | Status: AC | PRN

## 2020-07-24 MED ORDER — HYDROMORPHONE HCL 1 MG/ML IJ SOLN
1.0000 mg | Freq: Once | INTRAMUSCULAR | Status: AC
Start: 2020-07-24 — End: 2020-07-24
  Administered 2020-07-24: 1 mg via INTRAVENOUS
  Filled 2020-07-24: qty 1

## 2020-07-24 MED ORDER — PROPOFOL 10 MG/ML IV BOLUS
INTRAVENOUS | Status: DC | PRN
  Administered 2020-07-24: 150 mg via INTRAVENOUS

## 2020-07-24 MED ORDER — SUCCINYLCHOLINE CHLORIDE 200 MG/10ML IV SOSY
PREFILLED_SYRINGE | INTRAVENOUS | Status: DC | PRN
  Administered 2020-07-24: 100 mg via INTRAVENOUS

## 2020-07-24 MED ORDER — PROMETHAZINE HCL 25 MG/ML IJ SOLN: 6.2500 mg | INTRAMUSCULAR | Status: DC | PRN

## 2020-07-24 MED ORDER — MIDAZOLAM HCL 2 MG/2ML IJ SOLN
0.5000 mg | Freq: Once | INTRAMUSCULAR | Status: DC | PRN
Start: 2020-07-24 — End: 2020-07-24

## 2020-07-24 MED ORDER — STERILE WATER FOR IRRIGATION IR SOLN
Status: DC | PRN
  Administered 2020-07-24: 3000 mL

## 2020-07-24 MED ORDER — PHENYLEPHRINE HCL-NACL 10-0.9 MG/250ML-% IV SOLN
INTRAVENOUS | Status: DC | PRN
  Administered 2020-07-24: 70 ug/min via INTRAVENOUS

## 2020-07-24 MED ORDER — SCOPOLAMINE 1 MG/3DAYS TD PT72: 1.0000 | MEDICATED_PATCH | TRANSDERMAL | Status: DC

## 2020-07-24 MED ORDER — DEXAMETHASONE SODIUM PHOSPHATE 10 MG/ML IJ SOLN
INTRAMUSCULAR | Status: DC | PRN
  Administered 2020-07-24: 10 mg via INTRAVENOUS

## 2020-07-24 MED ORDER — TAMSULOSIN HCL 0.4 MG PO CAPS: 0.4000 mg | ORAL_CAPSULE | Freq: Every day | ORAL | 0 refills | Status: AC

## 2020-07-24 MED ORDER — IOHEXOL 300 MG/ML  SOLN
INTRAMUSCULAR | Status: DC | PRN
  Administered 2020-07-24: 5 mL

## 2020-07-24 MED ORDER — FENTANYL CITRATE (PF) 100 MCG/2ML IJ SOLN
INTRAMUSCULAR | Status: AC
  Filled 2020-07-24: qty 2

## 2020-07-24 MED ORDER — FENTANYL CITRATE (PF) 100 MCG/2ML IJ SOLN
INTRAMUSCULAR | Status: DC | PRN
  Administered 2020-07-24: 100 ug via INTRAVENOUS

## 2020-07-24 MED ORDER — ONDANSETRON HCL 4 MG/2ML IJ SOLN
INTRAMUSCULAR | Status: AC
  Filled 2020-07-24: qty 2

## 2020-07-24 MED ORDER — PROPOFOL 10 MG/ML IV BOLUS
INTRAVENOUS | Status: AC
  Filled 2020-07-24: qty 20

## 2020-07-24 MED ORDER — DEXAMETHASONE SODIUM PHOSPHATE 10 MG/ML IJ SOLN
INTRAMUSCULAR | Status: AC
  Filled 2020-07-24: qty 1

## 2020-07-24 MED ORDER — LIDOCAINE 2% (20 MG/ML) 5 ML SYRINGE
INTRAMUSCULAR | Status: DC | PRN
  Administered 2020-07-24: 40 mg via INTRAVENOUS

## 2020-07-24 MED ORDER — CEFAZOLIN SODIUM-DEXTROSE 2-4 GM/100ML-% IV SOLN
2.0000 g | Freq: Once | INTRAVENOUS | Status: AC
  Administered 2020-07-24: 2 g via INTRAVENOUS
  Filled 2020-07-24: qty 100

## 2020-07-24 MED ORDER — HYDROMORPHONE HCL 1 MG/ML IJ SOLN
0.2500 mg | INTRAMUSCULAR | Status: DC | PRN
  Administered 2020-07-24: 0.25 mg via INTRAVENOUS

## 2020-07-24 MED ORDER — CIPROFLOXACIN HCL 500 MG PO TABS: 500.0000 mg | ORAL_TABLET | Freq: Two times a day (BID) | ORAL | 0 refills | Status: AC

## 2020-07-24 MED ORDER — ORAL CARE MOUTH RINSE: 15.0000 mL | Freq: Once | OROMUCOSAL | Status: AC

## 2020-07-24 MED ORDER — MIDAZOLAM HCL 5 MG/5ML IJ SOLN
INTRAMUSCULAR | Status: DC | PRN
  Administered 2020-07-24: 2 mg via INTRAVENOUS

## 2020-07-24 MED ORDER — LACTATED RINGERS IV SOLN: INTRAVENOUS | Status: DC

## 2020-07-24 SURGICAL SUPPLY — 14 items
BAG URO CATCHER STRL LF (MISCELLANEOUS) ×2 IMPLANT
CATH URET 5FR 28IN OPEN ENDED (CATHETERS) ×2 IMPLANT
CLOTH BEACON ORANGE TIMEOUT ST (SAFETY) ×2 IMPLANT
GLOVE SURG ENC TEXT LTX SZ7 (GLOVE) ×2 IMPLANT
GOWN STRL REUS W/TWL LRG LVL3 (GOWN DISPOSABLE) ×4 IMPLANT
GUIDEWIRE STR DUAL SENSOR (WIRE) IMPLANT
GUIDEWIRE ZIPWRE .038 STRAIGHT (WIRE) ×2 IMPLANT
KIT TURNOVER KIT A (KITS) ×2 IMPLANT
MANIFOLD NEPTUNE II (INSTRUMENTS) ×2 IMPLANT
PACK CYSTO (CUSTOM PROCEDURE TRAY) ×2 IMPLANT
STENT URET 6FRX24 CONTOUR (STENTS) ×2 IMPLANT
SYR 10ML LL (SYRINGE) ×2 IMPLANT
TUBING CONNECTING 10 (TUBING) ×2 IMPLANT
TUBING UROLOGY SET (TUBING) IMPLANT

## 2020-07-24 NOTE — ED Provider Notes (Signed)
[facility] EMERGENCY DEPARTMENT Provider Note   CSN: [id number] Arrival date & time: 07/24/20  0048     History Chief Complaint  Patient presents with  . Back Pain    Judy Weaver is a 55 y.o. female.  Patient presents to the ER for evaluation of right flank pain with nausea and vomiting.  Symptoms began somewhat suddenly around 10:30 PM.  Patient reports constant, sharp and stabbing, severe, 10 out of 10 pain in the right flank.  No urinary symptoms.        Past Medical History:  Diagnosis Date  . Abnormal Pap smear   . Bilateral swelling of feet   . Breech birth   . Chicken pox   . Depression    Post Partum  . Dysmenorrhea   . Fibroid   . Group B streptococcal infection in pregnancy   . H/O wisdom tooth extraction   . High cholesterol   . Hypertension    during pregnancy  . Pre-diabetes   . Preeclampsia   . Preterm labor   . Shortness of breath   . Sleep apnea     Patient Active Problem List   Diagnosis Date Noted  . Overweight(278.02) 09/29/2011  . Menopausal symptoms 09/29/2011  . Fibroids 08/05/2011    Past Surgical History:  Procedure Laterality Date  . ABDOMINOPLASTY    . AUGMENTATION MAMMAPLASTY Bilateral   . BREAST ENHANCEMENT SURGERY    . BREAST SURGERY    . CESAREAN SECTION    . DILATATION & CURRETTAGE/HYSTEROSCOPY WITH RESECTOCOPE N/A 07/16/2013   Procedure: [place];  Surgeon: Ena Dawley, MD;  Location: [facility];  Service: Gynecology;  Laterality: N/A;  . HERNIA REPAIR     umbilical  . HYSTEROSCOPY N/A 07/16/2013   Procedure: HYSTEROSCOPY;  Surgeon: Ena Dawley, MD;  Location: [facility];  Service: Gynecology;  Laterality: N/A;  . LAPAROSCOPIC TUBAL LIGATION Bilateral 07/16/2013   Procedure: LAPAROSCOPIC TUBAL;  Surgeon: Ena Dawley, MD;  Location: [facility];  Service: Gynecology;  Laterality: Bilateral;  . NOVASURE ABLATION N/A 07/16/2013   Procedure: NOVASURE ABLATION  WITH REMOVAL OF IUD;  Surgeon: Ena Dawley, MD;  Location: [facility];  Service: Gynecology;  Laterality: N/A;  . post partum depression    . WISDOM TOOTH EXTRACTION       OB History    Gravida  3   Para  2   Term      Preterm      AB      Living        SAB      IAB      Ectopic      Multiple      Live Births              Family History  Problem Relation Age of Onset  . Breast cancer Maternal Aunt   . Hyperlipidemia Mother   . Heart disease Mother   . Diabetes Father   . Hypertension Father   . Hyperlipidemia Father   . Anxiety disorder Father     Social History   Tobacco Use  . Smoking status: Never Smoker  . Smokeless tobacco: Never Used  Substance Use Topics  . Alcohol use: Yes    Alcohol/week: 2.0 standard drinks    Types: 2 Glasses of wine per week    Comment: social  . Drug use: No    Home Medications Prior to Admission medications   Medication Sig Start Date End  Date Taking? Authorizing Provider  aspirin EC 81 MG tablet Take 81 mg by mouth daily.   Yes [provider]  Aspirin-Salicylamide-Caffeine (BC HEADACHE POWDER PO) Take by mouth.   Yes [provider]  buPROPion (WELLBUTRIN XL) 300 MG 24 hr tablet Take 300 mg by mouth daily.   Yes [provider]  Cholecalciferol (VITAMIN D3) 50 MCG (2000 UT) TABS Take by mouth.   Yes [provider]  citalopram (CELEXA) 40 MG tablet Take 40 mg by mouth daily.   Yes [provider]  lamoTRIgine (LAMICTAL) 200 MG tablet Take 200 mg by mouth daily.   Yes [provider]  lisinopril (ZESTRIL) 10 MG tablet Take 10 mg by mouth daily.   Yes [provider]  METFORMIN HCL PO Take by mouth.   Yes [provider]  Multiple Vitamin (MULTIVITAMIN WITH MINERALS) TABS tablet Take 1 tablet by mouth daily.   Yes [provider]  ondansetron (ZOFRAN ODT) 4 MG disintegrating tablet 4mg  ODT q4 hours prn nausea/vomit 07/24/20  Yes Tish Begin,  Gwenyth Allegra, MD  oxyCODONE-acetaminophen (PERCOCET) 5-325 MG tablet Take 1-2 tablets by mouth every 4 (four) hours as needed. 07/24/20  Yes Gurbani Figge, Gwenyth Allegra, MD  rosuvastatin (CRESTOR) 10 MG tablet Take 10 mg by mouth daily.   Yes [provider]  tamsulosin (FLOMAX) 0.4 MG CAPS capsule Take 1 capsule (0.4 mg total) by mouth daily. 07/24/20  Yes Song Myre, Gwenyth Allegra, MD    Allergies    Sulfa antibiotics  Review of Systems   Review of Systems  Gastrointestinal: Positive for nausea and vomiting.  Genitourinary: Positive for flank pain.  All other systems reviewed and are negative.   Physical Exam Updated Vital Signs BP 130/76 (BP Location: Left Arm)   Pulse 78   Temp 97.9 F (36.6 C) (Oral)   Resp 19   Ht 5\' 2"  (1.575 m)   Wt 79.4 kg   SpO2 98%   BMI 32.01 kg/m   Physical Exam Vitals and nursing note reviewed.  Constitutional:      General: She is in acute distress.     Appearance: Normal appearance. She is well-developed.  HENT:     Head: Normocephalic and atraumatic.     Right Ear: Hearing normal.     Left Ear: Hearing normal.     Nose: Nose normal.  Eyes:     Conjunctiva/sclera: Conjunctivae normal.     Pupils: Pupils are equal, round, and reactive to light.  Cardiovascular:     Rate and Rhythm: Regular rhythm.     Heart sounds: S1 normal and S2 normal. No murmur heard. No friction rub. No gallop.   Pulmonary:     Effort: Pulmonary effort is normal. No respiratory distress.     Breath sounds: Normal breath sounds.  Chest:     Chest wall: No tenderness.  Abdominal:     General: Bowel sounds are normal.     Palpations: Abdomen is soft.     Tenderness: There is no abdominal tenderness. There is right CVA tenderness. There is no guarding or rebound. Negative signs include Murphy's sign and McBurney's sign.     Hernia: No hernia is present.  Musculoskeletal:        General: Normal range of motion.     Cervical back: Normal range of motion and neck  supple.  Skin:    General: Skin is warm and dry.     Findings: No rash.  Neurological:     Mental  Status: She is alert and oriented to person, place, and time.     GCS: GCS eye subscore is 4. GCS verbal subscore is 5. GCS motor subscore is 6.     Cranial Nerves: No cranial nerve deficit.     Sensory: No sensory deficit.     Coordination: Coordination normal.  Psychiatric:        Speech: Speech normal.        Behavior: Behavior normal.        Thought Content: Thought content normal.     ED Results / Procedures / Treatments   Labs (all labs ordered are listed, but only abnormal results are displayed) Labs Reviewed  URINALYSIS, ROUTINE W REFLEX MICROSCOPIC - Abnormal; Notable for the following components:      Result Value   APPearance HAZY (*)    Specific Gravity, Urine >1.030 (*)    Hgb urine dipstick LARGE (*)    Leukocytes,Ua TRACE (*)    All other components within normal limits  CBC WITH DIFFERENTIAL/PLATELET - Abnormal; Notable for the following components:   WBC 11.6 (*)    All other components within normal limits  BASIC METABOLIC PANEL - Abnormal; Notable for the following components:   Glucose, Bld 140 (*)    BUN 32 (*)    Creatinine, Ser 1.07 (*)    All other components within normal limits  URINALYSIS, MICROSCOPIC (REFLEX) - Abnormal; Notable for the following components:   Bacteria, UA RARE (*)    All other components within normal limits    EKG None  Radiology CT RENAL STONE STUDY  Result Date: 07/24/2020 CLINICAL DATA:  Right flank pain. EXAM: CT ABDOMEN AND PELVIS WITHOUT CONTRAST TECHNIQUE: Multidetector CT imaging of the abdomen and pelvis was performed following the standard protocol without IV contrast. COMPARISON:  March 28, 2007 FINDINGS: Lower chest: Bilateral breast implants are noted. Hepatobiliary: There is diffuse fatty infiltration of the liver parenchyma. No focal liver abnormality is seen. No gallstones, gallbladder wall thickening, or  biliary dilatation. Pancreas: Unremarkable. No pancreatic ductal dilatation or surrounding inflammatory changes. Spleen: Normal in size without focal abnormality. Adrenals/Urinary Tract: Adrenal glands are unremarkable. Kidneys are normal in size, without focal lesions. A 6 mm obstructing renal stone is seen within the proximal right ureter with moderate severity right-sided hydronephrosis and hydroureter. Moderate to marked severity right-sided perinephric inflammatory fat stranding is also seen. Bladder is unremarkable. Stomach/Bowel: Stomach is within normal limits. Appendix appears normal. No evidence of bowel wall thickening, distention, or inflammatory changes. Noninflamed diverticula are seen within the descending and sigmoid colon. Vascular/Lymphatic: Aortic atherosclerosis. No enlarged abdominal or pelvic lymph nodes. Reproductive: Uterus and bilateral adnexa are unremarkable. Other: No abdominal wall hernia or abnormality. No abdominopelvic ascites. Musculoskeletal: Degenerative changes are seen within the lumbar spine at the level of L4-L5. IMPRESSION: 1. 6 mm obstructing renal stone within the proximal right ureter. 2. Colonic diverticulosis. Electronically Signed   By: Virgina Norfolk M.D.   On: 07/24/2020 03:42    Procedures Procedures   Medications Ordered in ED Medications  HYDROmorphone (DILAUDID) injection 1 mg (1 mg Intravenous Given 07/24/20 0155)  ondansetron (ZOFRAN) injection 4 mg (4 mg Intravenous Given 07/24/20 0155)  HYDROmorphone (DILAUDID) injection 1 mg (1 mg Intravenous Given 07/24/20 0357)  ondansetron (ZOFRAN) injection 4 mg (4 mg Intravenous Given 07/24/20 0406)    ED Course  I have reviewed the triage vital signs and the nursing notes.  Pertinent labs & imaging results that were available during my care  of the patient were reviewed by me and considered in my medical decision making (see chart for details).    MDM Rules/Calculators/A&P                         Patient  presents with complaints of sudden onset right flank pain.  Work-up reveals a 6 mm proximal ureteral stone with hydronephrosis.  No evidence of concomitant infection.  Normal renal function.  Patient has achieved reasonably good pain control here in the emergency department.  Will discharge with Flomax, analgesia, follow-up with urology.  Patient understands that if she has worsening symptoms she should go to [facility] long as she might need urology intervention.  Final Clinical Impression(s) / ED Diagnoses Final diagnoses:  Renal colic on right side    Rx / DC Orders ED Discharge Orders         Ordered    oxyCODONE-acetaminophen (PERCOCET) 5-325 MG tablet  Every 4 hours PRN        07/24/20 0526    tamsulosin (FLOMAX) 0.4 MG CAPS capsule  Daily        07/24/20 0526    ondansetron (ZOFRAN ODT) 4 MG disintegrating tablet        07/24/20 0526           Orpah Greek, MD 07/24/20 [phone]

## 2020-07-24 NOTE — Anesthesia Preprocedure Evaluation (Addendum)
Anesthesia Evaluation  Patient identified by MRN, date of birth, ID band Patient awake    Reviewed: Allergy & Precautions, NPO status , Patient's Chart, lab work & pertinent test results  History of Anesthesia Complications Negative for: history of anesthetic complications  Airway Mallampati: II  TM Distance: >3 FB Neck ROM: Full    Dental  (+) Dental Advisory Given   Pulmonary sleep apnea (does not use CPAP) ,  07/24/2020 SARS coronavirus NEG   breath sounds clear to auscultation       Cardiovascular hypertension, Pt. on medications  Rhythm:Regular Rate:Normal     Neuro/Psych negative neurological ROS     GI/Hepatic Neg liver ROS, GERD  Controlled,N/v with this kidney stone   Endo/Other  obese  Renal/GU negative Renal ROS     Musculoskeletal   Abdominal (+) + obese,   Peds  Hematology negative hematology ROS (+)   Anesthesia Other Findings   Reproductive/Obstetrics                            Anesthesia Physical Anesthesia Plan  ASA: II  Anesthesia Plan: General   Post-op Pain Management:    Induction: Intravenous  PONV Risk Score and Plan: 3 and Ondansetron, Dexamethasone and Scopolamine patch - Pre-op  Airway Management Planned: Oral ETT  Additional Equipment:   Intra-op Plan:   Post-operative Plan: Extubation in OR  Informed Consent: I have reviewed the patients History and Physical, chart, labs and discussed the procedure including the risks, benefits and alternatives for the proposed anesthesia with the patient or authorized representative who has indicated his/her understanding and acceptance.     Dental advisory given  Plan Discussed with: CRNA and Surgeon  Anesthesia Plan Comments:        Anesthesia Quick Evaluation

## 2020-07-24 NOTE — Anesthesia Postprocedure Evaluation (Addendum)
Anesthesia Post Note  Patient: Kazuko Clemence  Procedure(s) Performed: CYSTOSCOPY WITH RETROGRADE PYELOGRAM/URETERAL STENT PLACEMENT (Right )     Patient location during evaluation: PACU Anesthesia Type: General Level of consciousness: awake and alert, patient cooperative and oriented Pain management: pain level controlled Vital Signs Assessment: post-procedure vital signs reviewed and stable Respiratory status: spontaneous breathing, nonlabored ventilation and respiratory function stable Cardiovascular status: blood pressure returned to baseline and stable Postop Assessment: no apparent nausea or vomiting and able to ambulate Anesthetic complications: no   No complications documented.  Last Vitals:  Vitals:   07/24/20 1820 07/24/20 1825  BP: 132/78 132/78  Pulse:    Resp:    Temp:    SpO2:      Last Pain:  Vitals:   07/24/20 1820  TempSrc:   PainSc: 2                  Elenna Spratling,E. Anesia Blackwell

## 2020-07-24 NOTE — H&P (Signed)
Visit Report     07/24/2020   --------------------------------------------------------------------------------   Judy Weaver  MRN: [id number]  DOB: 1965/10/24, 55 year old Female  SSN:    PRIMARY CARE:  Cari Caraway, MD  REFERRING:    PROVIDER:  Rexene Alberts, M.D.  LOCATION:  [facility] [phone]     --------------------------------------------------------------------------------   CC/HPI: Judy Weaver is a 55 year old female seen in consultation today for right flank pain and right proximal ureteral stone.   She presented to [facility] ED on 07/24/2020 with acute right flank pain reported 10/10 associated with nausea and emesis. She was afebrile. She denies dysuria. White blood cell count was elevated 11.6. Creatinine was 1.07. She had rare bacteria on urinalysis. CT A/P demonstrated 6 mm obstructing stone at the right proximal ureter. She denies prior stone episodes.     ALLERGIES: Sulfa - Vomiting    MEDICATIONS: BUPROPION HCL PO Daily  CITALOPRAM HBR PO Daily  Crestor Daily  Flomax  LAMICTAL PO Daily  LISINOPRIL PO Daily  METFORMIN HCL PO Daily  Multivitamin  Oxycodone Hcl     GU PSH: None   NON-GU PSH: Colonoscopy C-Section     GU PMH: None   NON-GU PMH: GERD Hypercholesterolemia Hypertension    FAMILY HISTORY: 1 Daughter - Runs in Family 1 son - Runs in Family Congestive Heart Failure - Mother High Blood Pressure - Father Kidney Stones - Runs In Family   SOCIAL HISTORY: Marital Status: Married Current Smoking Status: Patient has never smoked.  Does not use smokeless tobacco. Social Drinker.  Does not use drugs. Drinks 3 caffeinated drinks per day. Has not had a blood transfusion. Patient's occupation Research officer, political party.    REVIEW OF SYSTEMS:    GU Review Female:   Patient denies frequent urination, hard to postpone urination, burning /pain with urination, get up at night to urinate, leakage of urine, stream starts and stops, trouble  starting your stream, have to strain to urinate, and being pregnant.  Gastrointestinal (Upper):   Patient denies nausea, vomiting, and indigestion/ heartburn.  Gastrointestinal (Lower):   Patient denies diarrhea and constipation.  Constitutional:   Patient denies fever, night sweats, weight loss, and fatigue.  Skin:   Patient denies skin rash/ lesion and itching.  Eyes:   Patient denies blurred vision and double vision.  Ears/ Nose/ Throat:   Patient denies sore throat and sinus problems.  Hematologic/Lymphatic:   Patient denies swollen glands and easy bruising.  Cardiovascular:   Patient denies leg swelling and chest pains.  Respiratory:   Patient denies cough and shortness of breath.  Endocrine:   Patient denies excessive thirst.  Musculoskeletal:   Patient denies back pain and joint pain.  Neurological:   Patient denies headaches and dizziness.  Psychologic:   Patient denies depression and anxiety.   VITAL SIGNS:      07/24/2020 10:10 AM  Weight 175 lb / 79.38 kg  Height 62 in / 157.48 cm  BP 127/84 mmHg  Pulse 91 /min  Temperature 98.4 F / 36.8 C  BMI 32.0 kg/m   MULTI-SYSTEM PHYSICAL EXAMINATION:    Constitutional: Well-nourished. No physical deformities. Normally developed. Good grooming.  Respiratory: No labored breathing, no use of accessory muscles.   Cardiovascular: Normal temperature, normal extremity pulses, no swelling, no varicosities.  Gastrointestinal: No mass, no tenderness, no rigidity, non obese abdomen. No CVA tenderness     Complexity of Data:  Source Of History:  Patient, Medical Record Summary  Records Review:  Previous Doctor Records, Previous Hospital Records, Previous Patient Records  Urine Test Review:   Urinalysis  X-Ray Review: C.T. Stone Protocol: Reviewed Films. Reviewed Report. Discussed With Patient.    Notes:                     CLINICAL DATA: Right flank pain.   EXAM:  CT ABDOMEN AND PELVIS WITHOUT CONTRAST   TECHNIQUE:  Multidetector CT  imaging of the abdomen and pelvis was performed  following the standard protocol without IV contrast.   COMPARISON: March 28, 2007   FINDINGS:  Lower chest: Bilateral breast implants are noted.   Hepatobiliary: There is diffuse fatty infiltration of the liver  parenchyma. No focal liver abnormality is seen. No gallstones,  gallbladder wall thickening, or biliary dilatation.   Pancreas: Unremarkable. No pancreatic ductal dilatation or  surrounding inflammatory changes.   Spleen: Normal in size without focal abnormality.   Adrenals/Urinary Tract: Adrenal glands are unremarkable. Kidneys are  normal in size, without focal lesions. A 6 mm obstructing renal  stone is seen within the proximal right ureter with moderate  severity right-sided hydronephrosis and hydroureter. Moderate to  marked severity right-sided perinephric inflammatory fat stranding  is also seen. Bladder is unremarkable.   Stomach/Bowel: Stomach is within normal limits. Appendix appears  normal. No evidence of bowel wall thickening, distention, or  inflammatory changes. Noninflamed diverticula are seen within the  descending and sigmoid colon.   Vascular/Lymphatic: Aortic atherosclerosis. No enlarged abdominal or  pelvic lymph nodes.   Reproductive: Uterus and bilateral adnexa are unremarkable.   Other: No abdominal wall hernia or abnormality. No abdominopelvic  ascites.   Musculoskeletal: Degenerative changes are seen within the lumbar  spine at the level of L4-L5.   IMPRESSION:  1. 6 mm obstructing renal stone within the proximal right ureter.  2. Colonic diverticulosis.    Electronically Signed  By: Virgina Norfolk M.D.  On: 07/24/2020 03:42   PROCEDURES: None   ASSESSMENT:      ICD-10 Details  1 GU:   Ureteral calculus - N20.1   2   Flank Pain - R10.84    PLAN:           Orders Labs Urinalysis, CULTURE, URINE          Document Letter(s):  Created for Patient: Clinical Summary          Notes:   #1. Right proximal ureteral stone: Seen on CT A/P 07/24/2020. We discussed options including medical expulsive therapy, right ureteral stent placement, definitive therapy with ESWL or ureteroscopy with laser lithotripsy. We had a long discussion about options, potential complications and timing. Given her refractory pain, she elects to proceed with right ureteral stent placement today. We will then schedule her for right ureteroscopy with laser lithotripsy and basket extraction of stone hopefully next week. We discussed risk and benefits of this procedure. Send urine for culture.   CC: Cari Caraway, MD    Signed by Rexene Alberts, M.D. on 07/24/20 at 11:23 AM (EDT)  Urology Preoperative H&P   Chief Complaint: Right proximal ureteral stone with obstruction  History of Present Illness: Judy Weaver is a 55 y.o. female with right proximal ureteral stone with obstruction here for cysto, R RPG, right stent placement.    Past Medical History:  Diagnosis Date  . Abnormal Pap smear   . Bilateral swelling of feet   . Breech birth   . Chicken pox   . Depression    Post  Partum  . Dysmenorrhea   . Fibroid   . Group B streptococcal infection in pregnancy   . H/O wisdom tooth extraction   . High cholesterol   . History of kidney stones   . Hypertension    during pregnancy  . Pre-diabetes   . Preeclampsia   . Preterm labor   . Shortness of breath   . Sleep apnea     Past Surgical History:  Procedure Laterality Date  . ABDOMINOPLASTY    . AUGMENTATION MAMMAPLASTY Bilateral   . BREAST ENHANCEMENT SURGERY    . BREAST SURGERY    . CESAREAN SECTION    . DILATATION & CURRETTAGE/HYSTEROSCOPY WITH RESECTOCOPE N/A 07/16/2013   Procedure: [place];  Surgeon: Ena Dawley, MD;  Location: [facility];  Service: Gynecology;  Laterality: N/A;  . HERNIA REPAIR     umbilical  . HYSTEROSCOPY N/A 07/16/2013   Procedure: HYSTEROSCOPY;  Surgeon:  Ena Dawley, MD;  Location: [facility];  Service: Gynecology;  Laterality: N/A;  . LAPAROSCOPIC TUBAL LIGATION Bilateral 07/16/2013   Procedure: LAPAROSCOPIC TUBAL;  Surgeon: Ena Dawley, MD;  Location: [facility];  Service: Gynecology;  Laterality: Bilateral;  . NOVASURE ABLATION N/A 07/16/2013   Procedure: NOVASURE ABLATION WITH REMOVAL OF IUD;  Surgeon: Ena Dawley, MD;  Location: [facility];  Service: Gynecology;  Laterality: N/A;  . post partum depression    . WISDOM TOOTH EXTRACTION      Allergies:  Allergies  Allergen Reactions  . Sulfa Antibiotics Nausea And Vomiting    Family History  Problem Relation Age of Onset  . Breast cancer Maternal Aunt   . Hyperlipidemia Mother   . Heart disease Mother   . Diabetes Father   . Hypertension Father   . Hyperlipidemia Father   . Anxiety disorder Father     Social History:  reports that she has never smoked. She has never used smokeless tobacco. She reports current alcohol use of about 2.0 standard drinks of alcohol per week. She reports that she does not use drugs.  ROS: A complete review of systems was performed.  All systems are negative except for pertinent findings as noted.  Physical Exam:  Vital signs in last 24 hours: Temp:  [97.9 F (36.6 C)-99.4 F (37.4 C)] 98.1 F (36.7 C) (05/06 1438) Pulse Rate:  [78-102] 102 (05/06 1438) Resp:  [16-20] 16 (05/06 1438) BP: (126-146)/(75-86) 126/86 (05/06 1438) SpO2:  [95 %-99 %] 98 % (05/06 1438) Weight:  [79.4 kg-81.7 kg] 81.7 kg (05/06 1438) Constitutional:  Alert and oriented, No acute distress Cardiovascular: Regular rate and rhythm Respiratory: Normal respiratory effort, Lungs clear bilaterally GI: Abdomen is soft, nontender, nondistended, no abdominal masses GU: No CVA tenderness Lymphatic: No lymphadenopathy Neurologic: Grossly intact, no focal deficits Psychiatric: Normal mood and affect  Laboratory Data:  Recent Labs    07/24/20 0204  WBC 11.6*  HGB 13.2  HCT  39.1  PLT 343    Recent Labs    07/24/20 0204  NA 139  K 4.1  CL 103  GLUCOSE 140*  BUN 32*  CALCIUM 9.4  CREATININE 1.07*     Results for orders placed or performed during the hospital encounter of 07/24/20 (from the past 24 hour(s))  Urinalysis, Routine w reflex microscopic     Status: Abnormal   Collection Time: 07/24/20  1:26 AM  Result Value Ref Range   Color, Urine YELLOW YELLOW   APPearance HAZY (A) CLEAR   Specific Gravity, Urine >1.030 (H)  1.005 - 1.030   pH 5.0 5.0 - 8.0   Glucose, UA NEGATIVE NEGATIVE mg/dL   Hgb urine dipstick LARGE (A) NEGATIVE   Bilirubin Urine NEGATIVE NEGATIVE   Ketones, ur NEGATIVE NEGATIVE mg/dL   Protein, ur NEGATIVE NEGATIVE mg/dL   Nitrite NEGATIVE NEGATIVE   Leukocytes,Ua TRACE (A) NEGATIVE  Urinalysis, Microscopic (reflex)     Status: Abnormal   Collection Time: 07/24/20  1:26 AM  Result Value Ref Range   RBC / HPF >50 0 - 5 RBC/hpf   WBC, UA 11-20 0 - 5 WBC/hpf   Bacteria, UA RARE (A) NONE SEEN   Squamous Epithelial / LPF 0-5 0 - 5   Mucus PRESENT   CBC with Differential/Platelet     Status: Abnormal   Collection Time: 07/24/20  2:04 AM  Result Value Ref Range   WBC 11.6 (H) 4.0 - 10.5 K/uL   RBC 4.34 3.87 - 5.11 MIL/uL   Hemoglobin 13.2 12.0 - 15.0 g/dL   HCT 39.1 36.0 - 46.0 %   MCV 90.1 80.0 - 100.0 fL   MCH 30.4 26.0 - 34.0 pg   MCHC 33.8 30.0 - 36.0 g/dL   RDW 13.6 11.5 - 15.5 %   Platelets 343 150 - 400 K/uL   nRBC 0.0 0.0 - 0.2 %   Neutrophils Relative % 65 %   Neutro Abs 7.7 1.7 - 7.7 K/uL   Lymphocytes Relative 23 %   Lymphs Abs 2.6 0.7 - 4.0 K/uL   Monocytes Relative 7 %   Monocytes Absolute 0.8 0.1 - 1.0 K/uL   Eosinophils Relative 3 %   Eosinophils Absolute 0.3 0.0 - 0.5 K/uL   Basophils Relative 1 %   Basophils Absolute 0.1 0.0 - 0.1 K/uL   Immature Granulocytes 1 %   Abs Immature Granulocytes 0.07 0.00 - 0.07 K/uL  Basic metabolic panel     Status: Abnormal   Collection Time: 07/24/20  2:04 AM   Result Value Ref Range   Sodium 139 135 - 145 mmol/L   Potassium 4.1 3.5 - 5.1 mmol/L   Chloride 103 98 - 111 mmol/L   CO2 25 22 - 32 mmol/L   Glucose, Bld 140 (H) 70 - 99 mg/dL   BUN 32 (H) 6 - 20 mg/dL   Creatinine, Ser 1.07 (H) 0.44 - 1.00 mg/dL   Calcium 9.4 8.9 - 10.3 mg/dL   GFR, Estimated >60 >60 mL/min   Anion gap 11 5 - 15   No results found for this or any previous visit (from the past 240 hour(s)).  Renal Function: Recent Labs    07/24/20 0204  CREATININE 1.07*   Estimated Creatinine Clearance: 60.9 mL/min (A) (by C-G formula based on SCr of 1.07 mg/dL (H)).  Radiologic Imaging: CT RENAL STONE STUDY  Result Date: 07/24/2020 CLINICAL DATA:  Right flank pain. EXAM: CT ABDOMEN AND PELVIS WITHOUT CONTRAST TECHNIQUE: Multidetector CT imaging of the abdomen and pelvis was performed following the standard protocol without IV contrast. COMPARISON:  March 28, 2007 FINDINGS: Lower chest: Bilateral breast implants are noted. Hepatobiliary: There is diffuse fatty infiltration of the liver parenchyma. No focal liver abnormality is seen. No gallstones, gallbladder wall thickening, or biliary dilatation. Pancreas: Unremarkable. No pancreatic ductal dilatation or surrounding inflammatory changes. Spleen: Normal in size without focal abnormality. Adrenals/Urinary Tract: Adrenal glands are unremarkable. Kidneys are normal in size, without focal lesions. A 6 mm obstructing renal stone is seen within the proximal right ureter with moderate severity right-sided hydronephrosis  and hydroureter. Moderate to marked severity right-sided perinephric inflammatory fat stranding is also seen. Bladder is unremarkable. Stomach/Bowel: Stomach is within normal limits. Appendix appears normal. No evidence of bowel wall thickening, distention, or inflammatory changes. Noninflamed diverticula are seen within the descending and sigmoid colon. Vascular/Lymphatic: Aortic atherosclerosis. No enlarged abdominal or  pelvic lymph nodes. Reproductive: Uterus and bilateral adnexa are unremarkable. Other: No abdominal wall hernia or abnormality. No abdominopelvic ascites. Musculoskeletal: Degenerative changes are seen within the lumbar spine at the level of L4-L5. IMPRESSION: 1. 6 mm obstructing renal stone within the proximal right ureter. 2. Colonic diverticulosis. Electronically Signed   By: Virgina Norfolk M.D.   On: 07/24/2020 03:42    I independently reviewed the above imaging studies.  Assessment and Plan Judy Weaver is a 55 y.o. female with right proximal ureteral stone with obstruction here for cysto, R RPG, right stent placement.  -The risks, benefits and alternatives of cystoscopy with right JJ stent placement was discussed with the patient.  Risks include, but are not limited to: bleeding, urinary tract infection, ureteral injury, ureteral stricture disease, chronic pain, urinary symptoms, bladder injury, stent migration, the need for nephrostomy tube placement, MI, CVA, DVT, PE and the inherent risks with general anesthesia.  The patient voices understanding and wishes to proceed.    Matt R. Amanda Pote MD 07/24/2020, 3:16 PM  [facility] Pager: [phone]): [phone]

## 2020-07-24 NOTE — Progress Notes (Signed)
COVID Vaccine Completed: Date COVID Vaccine completed: Has received booster: COVID vaccine manufacturer: [place]  Date of COVID positive in last 90 days:  PCP - Cari Caraway, MD Cardiologist -   Chest x-ray -  EKG - greater than 1 year in epic Stress Test -  ECHO -  Cardiac Cath -  Pacemaker/ICD device last checked:  Sleep Study -  CPAP -   Fasting Blood Sugar -  Checks Blood Sugar _____ times a day  Blood Thinner Instructions: Aspirin Instructions: Last Dose:  Activity level:  Unable to go up a flight of stairs without symptoms   Can go up a flight of stairs and activities of daily living without stopping and without symptoms   Able to exercise without symptoms     Anesthesia review:   Patient denies shortness of breath, fever, cough and chest pain at PAT appointment   Patient verbalized understanding of instructions that were given to them at the PAT appointment. Patient was also instructed that they will need to review over the PAT instructions again at home before surgery.

## 2020-07-24 NOTE — Op Note (Signed)
Operative Note  Preoperative diagnosis:  1.  Right proximal obstructing ureteral stone  Postoperative diagnosis: 1.  Right proximal obstructing ureteral stone  Procedure(s): 1.  Cystoscopy 2. Right retrograde pyelogram with interpretation 3. Right ureteral stent placement 4. Fluoroscopy <1 hour with intraoperative interpretation  Surgeon: Rexene Alberts, MD  Assistants:  None  Anesthesia:  General  Complications:  None  EBL:  Minimal  Specimens: 1.  ID Type Source Tests Collected by Time Destination  A : Right renal pelvis urine  Urine PATH Cytology Urine URINE CULTURE Janith Lima, MD 07/24/2020 1654     Drains/Catheters: 1.  6Fr x 24cm ureteral stent  Intraoperative findings:   1. Cystoscopy demonstrated no suspicious lesions, masses, stones or other pathology. 2. Right retrograde pyelogram demonstrated moderate right hydronephrosis. 3. Successful right ureteral stent placement with curl in the renal pelvis and bladder respectively.  Indication:  Judy Weaver is a 55 y.o. female with acute right flank pain and found to have a 6 mm obstructing right proximal ureteral stone.  After reviewing the management options for treatment, she elected to proceed with the above surgical procedure(s). We have discussed the potential benefits and risks of the procedure, side effects of the proposed treatment, the likelihood of the patient achieving the goals of the procedure, and any potential problems that might occur during the procedure or recuperation. Informed consent has been obtained.  Description of procedure: The patient was taken to the operating room and general anesthesia was induced.  The patient was placed in the dorsal lithotomy position, prepped and draped in the usual sterile fashion, and preoperative antibiotics were administered. A preoperative time-out was performed.   Cystourethroscopy was performed.  The patient's urethra was examined and was normal. The  bladder was then systematically examined in its entirety. There was no evidence for any bladder tumors, stones, or other mucosal pathology.    Attention then turned to the right ureteral orifice. A 0.038 zip wire was passed through the right orifice and over the wire a 5 Fr open ended catheter was inserted and passed up to the level of the renal pelvis. There was a hydronephrotic drip. Aspirate was obtained and sent off as right renal pelvis urine for culture. Omnipaque contrast was injected through the ureteral catheter and a retrograde pyelogram was performed with findings as dictated above. The wire was then replaced and the open ended catheter was removed.   A 6Fr x 24cm ureteral stent was advance over the wire. The stent was positioned appropriately under fluoroscopic and cystoscopic guidance.  The wire was then removed with an adequate stent curl noted in the renal pelvis as well as in the bladder.  The bladder was then emptied and the procedure ended.  The patient appeared to tolerate the procedure well and without complications.  The patient was able to be awakened and transferred to the recovery unit in satisfactory condition.   Plan: She was discharged home today and plan to treat her right ureteral stone next week with laser lithotripsy.  Matt R. [facility]  Pager: [phone]

## 2020-07-24 NOTE — Progress Notes (Signed)
Pt up to bathroom via wheelchair with minimal assist. Voided without difficulty pink tinged urine. Discharge instructions reviewed with pt and spouse. All questions answered.

## 2020-07-24 NOTE — Transfer of Care (Signed)
Immediate Anesthesia Transfer of Care Note  Patient: Judy Weaver  Procedure(s) Performed: CYSTOSCOPY WITH RETROGRADE PYELOGRAM/URETERAL STENT PLACEMENT (Right )  Patient Location: PACU  Anesthesia Type:General  Level of Consciousness: awake, alert , oriented and patient cooperative  Airway & Oxygen Therapy: Patient Spontanous Breathing and Patient connected to face mask oxygen  Post-op Assessment: Report given to RN and Post -op Vital signs reviewed and stable  Post vital signs: Reviewed and stable  Last Vitals:  Vitals Value Taken Time  BP 99/63 07/24/20 1722  Temp    Pulse 112 07/24/20 1725  Resp 27 07/24/20 1725  SpO2 92 % 07/24/20 1725  Vitals shown include unvalidated device data.  Last Pain:  Vitals:   07/24/20 1447  TempSrc:   PainSc: 8       Patients Stated Pain Goal: 2 (51/02/58 5277)  Complications: No complications documented.

## 2020-07-24 NOTE — Discharge Instructions (Signed)
General Anesthesia, Adult, Care After This sheet gives you information about how to care for yourself after your procedure. Your health care provider may also give you more specific instructions. If you have problems or questions, contact your health care provider. What can I expect after the procedure? After the procedure, the following side effects are common:  Pain or discomfort at the IV site.  Nausea.  Vomiting.  Sore throat.  Trouble concentrating.  Feeling cold or chills.  Feeling weak or tired.  Sleepiness and fatigue.  Soreness and body aches. These side effects can affect parts of the body that were not involved in surgery. Follow these instructions at home: For the time period you were told by your health care provider:  Rest.  Do not participate in activities where you could fall or become injured.  Do not drive or use machinery.  Do not drink alcohol.  Do not take sleeping pills or medicines that cause drowsiness.  Do not make important decisions or sign legal documents.  Do not take care of children on your own.   Eating and drinking  Follow any instructions from your health care provider about eating or drinking restrictions.  When you feel hungry, start by eating small amounts of foods that are soft and easy to digest (bland), such as toast. Gradually return to your regular diet.  Drink enough fluid to keep your urine pale yellow.  If you vomit, rehydrate by drinking water, juice, or clear broth. General instructions  If you have sleep apnea, surgery and certain medicines can increase your risk for breathing problems. Follow instructions from your health care provider about wearing your sleep device: ? Anytime you are sleeping, including during daytime naps. ? While taking prescription pain medicines, sleeping medicines, or medicines that make you drowsy.  Have a responsible adult stay with you for the time you are told. It is important to have  someone help care for you until you are awake and alert.  Return to your normal activities as told by your health care provider. Ask your health care provider what activities are safe for you.  Take over-the-counter and prescription medicines only as told by your health care provider.  If you smoke, do not smoke without supervision.  Keep all follow-up visits as told by your health care provider. This is important. Contact a health care provider if:  You have nausea or vomiting that does not get better with medicine.  You cannot eat or drink without vomiting.  You have pain that does not get better with medicine.  You are unable to pass urine.  You develop a skin rash.  You have a fever.  You have redness around your IV site that gets worse. Get help right away if:  You have difficulty breathing.  You have chest pain.  You have blood in your urine or stool, or you vomit blood. Summary  After the procedure, it is common to have a sore throat or nausea. It is also common to feel tired.  Have a responsible adult stay with you for the time you are told. It is important to have someone help care for you until you are awake and alert.  When you feel hungry, start by eating small amounts of foods that are soft and easy to digest (bland), such as toast. Gradually return to your regular diet.  Drink enough fluid to keep your urine pale yellow.  Return to your normal activities as told by your health care provider.   Ask your health care provider what activities are safe for you. This information is not intended to replace advice given to you by your health care provider. Make sure you discuss any questions you have with your health care provider. Document Revised: 11/21/2019 Document Reviewed: 06/20/2019 Elsevier Patient Education  2021 [facility] [phone] Post Stent Instructions  Definitions:  Ureter: The duct that transports urine from  the kidney to the bladder. Stent:   A plastic hollow tube that is placed into the ureter, from the kidney to the bladder to prevent the ureter from swelling shut.  GENERAL INSTRUCTIONS:  Despite the fact that no skin incisions were used, the area around the ureter and bladder is raw and irritated. The stent is a foreign body which will further irritate the bladder wall. This irritation is manifested by increased frequency of urination, both day and night, and by an increase in the urge to urinate. In some, the urge to urinate is present almost always. Sometimes the urge is strong enough that you may not be able to stop yourself from urinating. The only real cure is to remove the stent and then give time for the bladder wall to heal which can't be done until the danger of the ureter swelling shut has passed, which varies.  You may see some blood in your urine while the stent is in place and a few days afterwards. Do not be alarmed, even if the urine was clear for a while. Get off your feet and drink lots of fluids until clearing occurs. If you start to pass clots or don't improve, call us.  DIET: You may return to your normal diet immediately. Because of the raw surface of your bladder, alcohol, spicy foods, acid type foods and drinks with caffeine may cause irritation or frequency and should be used in moderation. To keep your urine flowing freely and to avoid constipation, drink plenty of fluids during the day ( 8-10 glasses ). Tip: Avoid cranberry juice because it is very acidic.  ACTIVITY: Your physical activity doesn't need to be restricted. However, if you are very active, you may see some blood in your urine. We suggest that you reduce your activity under these circumstances until the bleeding has stopped.  BOWELS: It is important to keep your bowels regular during the postoperative period. Straining with bowel movements can cause bleeding. A bowel movement every other day is reasonable. Use a  mild laxative if needed, such as Milk of Magnesia 2-3 tablespoons, or 2 Dulcolax tablets. Call if you continue to have problems. If you have been taking narcotics for pain, before, during or after your surgery, you may be constipated. Take a laxative if necessary.   MEDICATION: You should resume your pre-surgery medications unless told not to. In addition you will often be given an antibiotic to prevent infection. These should be taken as prescribed until the bottles are finished unless you are having an unusual reaction to one of the drugs.  PROBLEMS YOU SHOULD REPORT TO [place]:  Fevers over 100.5 Fahrenheit.  Heavy bleeding, or clots ( See above notes about blood in urine ).  Inability to urinate.  Drug reactions ( hives, rash, nausea, vomiting, diarrhea ).  Severe burning or pain with urination that is not improving.  FOLLOW-UP: You will need a follow-up appointment to monitor your progress. Call for this appointment at the number listed above. Usually the first appointment will be about three to fourteen days after your surgery.

## 2020-07-24 NOTE — Anesthesia Procedure Notes (Addendum)
Procedure Name: Intubation Performed by: Cleda Daub, CRNA Pre-anesthesia Checklist: Patient identified, Emergency Drugs available, Suction available and Patient being monitored Patient Re-evaluated:Patient Re-evaluated prior to induction Oxygen Delivery Method: Circle system utilized Preoxygenation: Pre-oxygenation with 100% oxygen Induction Type: IV induction, Rapid sequence and Cricoid Pressure applied Laryngoscope Size: Mac and 3 Grade View: Grade I Tube type: Oral Number of attempts: 1 Airway Equipment and Method: Stylet and Oral airway Placement Confirmation: ETT inserted through vocal cords under direct vision,  positive ETCO2 and breath sounds checked- equal and bilateral Secured at: 21 cm Tube secured with: Tape Dental Injury: Teeth and Oropharynx as per pre-operative assessment

## 2020-07-24 NOTE — ED Triage Notes (Signed)
C/o right mid back pain x 2 hrs ago , n/v

## 2020-07-25 ENCOUNTER — Encounter (HOSPITAL_COMMUNITY): Payer: Self-pay | Admitting: Urology

## 2020-07-27 ENCOUNTER — Ambulatory Visit (HOSPITAL_COMMUNITY): Payer: 59

## 2020-07-27 ENCOUNTER — Ambulatory Visit (HOSPITAL_COMMUNITY): Payer: 59 | Admitting: Anesthesiology

## 2020-07-27 ENCOUNTER — Encounter (HOSPITAL_COMMUNITY)
Admission: RE | Disposition: A | Discharge status: Home / Self Care | Payer: Self-pay | Source: Home / Self Care | Attending: Urology

## 2020-07-27 ENCOUNTER — Ambulatory Visit (HOSPITAL_COMMUNITY)
Admission: RE | Admit: 2020-07-27 | Discharge: 2020-07-27 | Disposition: A | Discharge status: Home / Self Care | Payer: 59 | Attending: Urology | Admitting: Urology

## 2020-07-27 ENCOUNTER — Encounter (HOSPITAL_COMMUNITY): Payer: Self-pay | Admitting: Urology

## 2020-07-27 DIAGNOSIS — N201 Calculus of ureter: Secondary | ICD-10-CM

## 2020-07-27 DIAGNOSIS — Z7984 Long term (current) use of oral hypoglycemic drugs: Secondary | ICD-10-CM | POA: Insufficient documentation

## 2020-07-27 DIAGNOSIS — Z882 Allergy status to sulfonamides status: Secondary | ICD-10-CM | POA: Insufficient documentation

## 2020-07-27 DIAGNOSIS — K5730 Diverticulosis of large intestine without perforation or abscess without bleeding: Secondary | ICD-10-CM | POA: Diagnosis not present

## 2020-07-27 DIAGNOSIS — Z79899 Other long term (current) drug therapy: Secondary | ICD-10-CM | POA: Diagnosis not present

## 2020-07-27 HISTORY — DX: Personal history of urinary calculi: Z87.442

## 2020-07-27 HISTORY — PX: CYSTOSCOPY/URETEROSCOPY/HOLMIUM LASER/STENT PLACEMENT: SHX6546

## 2020-07-27 LAB — URINE CULTURE: Culture: NO GROWTH

## 2020-07-27 SURGERY — CYSTOSCOPY/URETEROSCOPY/HOLMIUM LASER/STENT PLACEMENT
Anesthesia: General | Laterality: Right

## 2020-07-27 MED ORDER — LIDOCAINE 2% (20 MG/ML) 5 ML SYRINGE
INTRAMUSCULAR | Status: DC | PRN
  Administered 2020-07-27 (×2): 50 mg via INTRAVENOUS

## 2020-07-27 MED ORDER — ACETAMINOPHEN 325 MG PO TABS: 325.0000 mg | ORAL_TABLET | ORAL | Status: DC | PRN

## 2020-07-27 MED ORDER — OXYCODONE HCL 5 MG PO TABS
ORAL_TABLET | ORAL | Status: AC
  Administered 2020-07-27: 5 mg via ORAL
  Filled 2020-07-27: qty 1

## 2020-07-27 MED ORDER — SODIUM CHLORIDE 0.9 % IR SOLN
Status: DC | PRN
  Administered 2020-07-27: 6000 mL

## 2020-07-27 MED ORDER — OXYCODONE-ACETAMINOPHEN 5-325 MG PO TABS: 1.0000 | ORAL_TABLET | ORAL | 0 refills | Status: AC | PRN

## 2020-07-27 MED ORDER — ONDANSETRON HCL 4 MG/2ML IJ SOLN
INTRAMUSCULAR | Status: DC | PRN
  Administered 2020-07-27: 4 mg via INTRAVENOUS

## 2020-07-27 MED ORDER — FENTANYL CITRATE (PF) 100 MCG/2ML IJ SOLN
INTRAMUSCULAR | Status: AC
  Filled 2020-07-27: qty 2

## 2020-07-27 MED ORDER — ACETAMINOPHEN 160 MG/5ML PO SOLN: 325.0000 mg | ORAL | Status: DC | PRN

## 2020-07-27 MED ORDER — DEXAMETHASONE SODIUM PHOSPHATE 10 MG/ML IJ SOLN
INTRAMUSCULAR | Status: DC | PRN
  Administered 2020-07-27: 8 mg via INTRAVENOUS

## 2020-07-27 MED ORDER — ONDANSETRON HCL 4 MG/2ML IJ SOLN
INTRAMUSCULAR | Status: AC
  Filled 2020-07-27: qty 2

## 2020-07-27 MED ORDER — FENTANYL CITRATE (PF) 100 MCG/2ML IJ SOLN: 25.0000 ug | INTRAMUSCULAR | Status: DC | PRN

## 2020-07-27 MED ORDER — OXYCODONE HCL 5 MG PO TABS: 5.0000 mg | ORAL_TABLET | Freq: Once | ORAL | Status: AC | PRN

## 2020-07-27 MED ORDER — LACTATED RINGERS IV SOLN: INTRAVENOUS | Status: DC

## 2020-07-27 MED ORDER — PROPOFOL 10 MG/ML IV BOLUS
INTRAVENOUS | Status: DC | PRN
  Administered 2020-07-27: 200 mg via INTRAVENOUS

## 2020-07-27 MED ORDER — PROPOFOL 10 MG/ML IV BOLUS
INTRAVENOUS | Status: AC
  Filled 2020-07-27: qty 20

## 2020-07-27 MED ORDER — SCOPOLAMINE 1 MG/3DAYS TD PT72
1.0000 | MEDICATED_PATCH | TRANSDERMAL | Status: DC
  Administered 2020-07-27: 1.5 mg via TRANSDERMAL

## 2020-07-27 MED ORDER — DEXAMETHASONE SODIUM PHOSPHATE 10 MG/ML IJ SOLN
INTRAMUSCULAR | Status: AC
  Filled 2020-07-27: qty 1

## 2020-07-27 MED ORDER — PROMETHAZINE HCL 25 MG/ML IJ SOLN: 6.2500 mg | INTRAMUSCULAR | Status: DC | PRN

## 2020-07-27 MED ORDER — CEFAZOLIN SODIUM-DEXTROSE 2-4 GM/100ML-% IV SOLN
INTRAVENOUS | Status: AC
  Filled 2020-07-27: qty 100

## 2020-07-27 MED ORDER — EPHEDRINE SULFATE-NACL 50-0.9 MG/10ML-% IV SOSY
PREFILLED_SYRINGE | INTRAVENOUS | Status: DC | PRN
  Administered 2020-07-27: 10 mg via INTRAVENOUS
  Administered 2020-07-27: 5 mg via INTRAVENOUS

## 2020-07-27 MED ORDER — ACETAMINOPHEN 10 MG/ML IV SOLN: 1000.0000 mg | Freq: Once | INTRAVENOUS | Status: DC | PRN

## 2020-07-27 MED ORDER — CEFAZOLIN SODIUM-DEXTROSE 2-4 GM/100ML-% IV SOLN
2.0000 g | Freq: Once | INTRAVENOUS | Status: AC
  Administered 2020-07-27: 2 g via INTRAVENOUS

## 2020-07-27 MED ORDER — AMISULPRIDE (ANTIEMETIC) 5 MG/2ML IV SOLN: 10.0000 mg | Freq: Once | INTRAVENOUS | Status: DC | PRN

## 2020-07-27 MED ORDER — IOHEXOL 300 MG/ML  SOLN
INTRAMUSCULAR | Status: DC | PRN
  Administered 2020-07-27: 11 mL via URETHRAL

## 2020-07-27 MED ORDER — 0.9 % SODIUM CHLORIDE (POUR BTL) OPTIME
TOPICAL | Status: DC | PRN
  Administered 2020-07-27: 1000 mL

## 2020-07-27 MED ORDER — PHENYLEPHRINE 40 MCG/ML (10ML) SYRINGE FOR IV PUSH (FOR BLOOD PRESSURE SUPPORT)
PREFILLED_SYRINGE | INTRAVENOUS | Status: DC | PRN
  Administered 2020-07-27 (×3): 120 ug via INTRAVENOUS
  Administered 2020-07-27: 40 ug via INTRAVENOUS

## 2020-07-27 MED ORDER — OXYCODONE HCL 5 MG/5ML PO SOLN: 5.0000 mg | Freq: Once | ORAL | Status: AC | PRN

## 2020-07-27 MED ORDER — FENTANYL CITRATE (PF) 100 MCG/2ML IJ SOLN
INTRAMUSCULAR | Status: DC | PRN
  Administered 2020-07-27 (×2): 25 ug via INTRAVENOUS

## 2020-07-27 SURGICAL SUPPLY — 22 items
BAG URO CATCHER STRL LF (MISCELLANEOUS) ×2 IMPLANT
BASKET ZERO TIP NITINOL 2.4FR (BASKET) ×2 IMPLANT
CATH URET 5FR 28IN OPEN ENDED (CATHETERS) ×2 IMPLANT
CATH URET FLEX-TIP 2 LUMEN 10F (CATHETERS) ×2 IMPLANT
CLOTH BEACON ORANGE TIMEOUT ST (SAFETY) ×2 IMPLANT
FIBER LASER MOSES 200 DFL (Laser) ×2 IMPLANT
GLOVE SURG ENC TEXT LTX SZ7 (GLOVE) ×2 IMPLANT
GOWN STRL REUS W/TWL LRG LVL3 (GOWN DISPOSABLE) ×2 IMPLANT
GUIDEWIRE ANG ZIPWIRE 035X150 (WIRE) ×2 IMPLANT
GUIDEWIRE STR DUAL SENSOR (WIRE) ×4 IMPLANT
GUIDEWIRE ZIPWRE .038 STRAIGHT (WIRE) IMPLANT
IV NS 1000ML (IV SOLUTION) ×2
IV NS 1000ML BAXH (IV SOLUTION) ×1 IMPLANT
KIT TURNOVER KIT A (KITS) ×2 IMPLANT
LASER FIB FLEXIVA PULSE ID 365 (Laser) IMPLANT
MANIFOLD NEPTUNE II (INSTRUMENTS) ×2 IMPLANT
PACK CYSTO (CUSTOM PROCEDURE TRAY) ×2 IMPLANT
SHEATH URETERAL 12FRX35CM (MISCELLANEOUS) ×2 IMPLANT
TRACTIP FLEXIVA PULS ID 200XHI (Laser) IMPLANT
TRACTIP FLEXIVA PULSE ID 200 (Laser)
TUBING CONNECTING 10 (TUBING) ×2 IMPLANT
TUBING UROLOGY SET (TUBING) ×2 IMPLANT

## 2020-07-27 NOTE — Discharge Instructions (Signed)
[facility] [phone] Post Ureteroscopy With or Without Stent Instructions  Definitions:  Ureter: The duct that transports urine from the kidney to the bladder. Stent:   A plastic hollow tube that is placed into the ureter, from the kidney to the bladder to prevent the ureter from swelling shut.  GENERAL INSTRUCTIONS:  Despite the fact that no skin incisions were used, the area around the ureter and bladder is raw and irritated. The stent is a foreign body which will further irritate the bladder wall. This irritation is manifested by increased frequency of urination, both day and night, and by an increase in the urge to urinate. In some, the urge to urinate is present almost always. Sometimes the urge is strong enough that you may not be able to stop yourself from urinating. The only real cure is to remove the stent and then give time for the bladder wall to heal which can't be done until the danger of the ureter swelling shut has passed, which varies.  You may see some blood in your urine while the stent is in place and a few days afterwards. Do not be alarmed, even if the urine was clear for a while. Get off your feet and drink lots of fluids until clearing occurs. If you start to pass clots or don't improve, call us.  DIET: You may return to your normal diet immediately. Because of the raw surface of your bladder, alcohol, spicy foods, acid type foods and drinks with caffeine may cause irritation or frequency and should be used in moderation. To keep your urine flowing freely and to avoid constipation, drink plenty of fluids during the day ( 8-10 glasses ). Tip: Avoid cranberry juice because it is very acidic.  ACTIVITY: Your physical activity doesn't need to be restricted. However, if you are very active, you may see some blood in your urine. We suggest that you reduce your activity under these circumstances until the bleeding has stopped.  BOWELS: It is important to  keep your bowels regular during the postoperative period. Straining with bowel movements can cause bleeding. A bowel movement every other day is reasonable. Use a mild laxative if needed, such as Milk of Magnesia 2-3 tablespoons, or 2 Dulcolax tablets. Call if you continue to have problems. If you have been taking narcotics for pain, before, during or after your surgery, you may be constipated. Take a laxative if necessary.   MEDICATION: You should resume your pre-surgery medications unless told not to. In addition you will often be given an antibiotic to prevent infection. These should be taken as prescribed until the bottles are finished unless you are having an unusual reaction to one of the drugs.  PROBLEMS YOU SHOULD REPORT TO [place]:  Fevers over 100.5 Fahrenheit.  Heavy bleeding, or clots ( See above notes about blood in urine ).  Inability to urinate.  Drug reactions ( hives, rash, nausea, vomiting, diarrhea ).  Severe burning or pain with urination that is not improving.  FOLLOW-UP: You will need a follow-up appointment to monitor your progress. Call for this appointment at the number listed above. Usually the first appointment will be about three to fourteen days after your surgery.  F/u next week for stent removal in the office.   General Anesthesia, Adult, Care After This sheet gives you information about how to care for yourself after your procedure. Your health care provider may also give you more specific instructions. If you have problems or questions, contact your health care  provider. What can I expect after the procedure? After the procedure, the following side effects are common:  Pain or discomfort at the IV site.  Nausea.  Vomiting.  Sore throat.  Trouble concentrating.  Feeling cold or chills.  Feeling weak or tired.  Sleepiness and fatigue.  Soreness and body aches. These side effects can affect parts of the body that were not involved in  surgery. Follow these instructions at home: For the time period you were told by your health care provider:  Rest.  Do not participate in activities where you could fall or become injured.  Do not drive or use machinery.  Do not drink alcohol.  Do not take sleeping pills or medicines that cause drowsiness.  Do not make important decisions or sign legal documents.  Do not take care of children on your own.   Eating and drinking  Follow any instructions from your health care provider about eating or drinking restrictions.  When you feel hungry, start by eating small amounts of foods that are soft and easy to digest (bland), such as toast. Gradually return to your regular diet.  Drink enough fluid to keep your urine pale yellow.  If you vomit, rehydrate by drinking water, juice, or clear broth. General instructions  If you have sleep apnea, surgery and certain medicines can increase your risk for breathing problems. Follow instructions from your health care provider about wearing your sleep device: ? Anytime you are sleeping, including during daytime naps. ? While taking prescription pain medicines, sleeping medicines, or medicines that make you drowsy.  Have a responsible adult stay with you for the time you are told. It is important to have someone help care for you until you are awake and alert.  Return to your normal activities as told by your health care provider. Ask your health care provider what activities are safe for you.  Take over-the-counter and prescription medicines only as told by your health care provider.  If you smoke, do not smoke without supervision.  Keep all follow-up visits as told by your health care provider. This is important. Contact a health care provider if:  You have nausea or vomiting that does not get better with medicine.  You cannot eat or drink without vomiting.  You have pain that does not get better with medicine.  You are unable to  pass urine.  You develop a skin rash.  You have a fever.  You have redness around your IV site that gets worse. Get help right away if:  You have difficulty breathing.  You have chest pain.  You have blood in your urine or stool, or you vomit blood. Summary  After the procedure, it is common to have a sore throat or nausea. It is also common to feel tired.  Have a responsible adult stay with you for the time you are told. It is important to have someone help care for you until you are awake and alert.  When you feel hungry, start by eating small amounts of foods that are soft and easy to digest (bland), such as toast. Gradually return to your regular diet.  Drink enough fluid to keep your urine pale yellow.  Return to your normal activities as told by your health care provider. Ask your health care provider what activities are safe for you. This information is not intended to replace advice given to you by your health care provider. Make sure you discuss any questions you have with your health care  provider. Document Revised: 11/21/2019 Document Reviewed: 06/20/2019 Elsevier Patient Education  2021 [employer].

## 2020-07-27 NOTE — Anesthesia Postprocedure Evaluation (Signed)
Anesthesia Post Note  Patient: Judy Weaver  Procedure(s) Performed: CYSTOSCOPY/ RETROGRADE/URETEROSCOPY/HOLMIUM LASER/STENT PLACEMENT (Right )     Patient location during evaluation: PACU Anesthesia Type: General Level of consciousness: awake and alert Pain management: pain level controlled Vital Signs Assessment: post-procedure vital signs reviewed and stable Respiratory status: spontaneous breathing, nonlabored ventilation, respiratory function stable and patient connected to nasal cannula oxygen Cardiovascular status: blood pressure returned to baseline and stable Postop Assessment: no apparent nausea or vomiting Anesthetic complications: no   No complications documented.  Last Vitals:  Vitals:   07/27/20 1815 07/27/20 1830  BP: 130/76 128/80  Pulse: 94 96  Resp: 16 20  Temp:  36.6 C  SpO2: 95% 96%    Last Pain:  Vitals:   07/27/20 1830  TempSrc:   PainSc: 4                  Tiajuana Amass

## 2020-07-27 NOTE — Op Note (Signed)
Operative Note  Preoperative diagnosis:  1.  Right ureteral stone  Postoperative diagnosis: 1.  Right renal stone  Procedure(s): 1.  Cystoscopy 2.  Right retrograde pyelogram 3.  Right ureteroscopy with laser lithotripsy and extraction of stone 4.  Right ureteral stent placement 5.  Fluoroscopy less than 1 hour  Surgeon: Rexene Alberts, MD  Assistants:  None  Anesthesia:  General  Complications:  None  EBL:  minimal  Specimens: 1. Stones for stone analysis  Drains/Catheters: 1.  6 x 24 cm right ureteral stent  Intraoperative findings:   1. 6 mm stone identified at the right renal pelvis  2. Moderate right hydronephrosis 3. Successful right ureteral stent replacement  Indication:  Judy Weaver is a 55 y.o. female who developed acute flank pain on 07/24/2020.  She was found to have a 6 mm obstructing right proximal ureteral stone.  She underwent right stent placement on 07/24/2020 for refractory pain.  She is here for definitive treatment of her right ureteral stone.  Her preoperative urine culture resulted negative. After reviewing the management options for treatment, she elected to proceed with the above surgical procedure(s). We have discussed the potential benefits and risks of the procedure, side effects of the proposed treatment, the likelihood of the patient achieving the goals of the procedure, and any potential problems that might occur during the procedure or recuperation. Informed consent has been obtained.  Description of procedure: After informed consent was obtained from the patient, the patient was identified and taken to the operating room and placed in the supine position.  General anesthesia was administered as well as perioperative IV antibiotics.  At the beginning of the case, a time-out was performed to properly identify the patient, the surgery to be performed, and the surgical site.  Sequential compression devices were applied to the lower extremities  at the beginning of the case for DVT prophylaxis.  The patient was then placed in the dorsal lithotomy supine position, prepped and draped in sterile fashion.  Preliminary scout fluoroscopy revealed that there was a 59mm calcification area at the right proximal ureter, which corresponds to the stone found on the preoperative CT scan. We then passed the 21-French rigid cystoscope through the urethra and into the bladder under vision without any difficulty, noting a normal urethra without strictures.  A systematic evaluation of the bladder revealed no evidence of any suspicious bladder lesions.  Ureteral orifices were in normal position.    The distal aspect of the ureteral stent was seen protruding from the right ureteral orifice.  We then used the alligator-tooth forceps and grasped the distal end of the ureteral stent and brought it out the urethral meatus while watching the proximal coil straighten out nicely on fluoroscopy. Through the ureteral stent, we then passed a 0.038 sensor wire up to the level of the renal pelvis.  The ureteral stent was then removed, leaving the sensor wire up the right ureter.    A semi-rigid ureteroscope was passed alongside the wire up the distal ureter which appeared normal. A second 0.038 zip wire was passed under direct vision and the semirigid scope was removed.   Under cystoscopic and flouroscopic guidance, we cannulated the right ureteral orifice with a 5-French open-ended ureteral catheter and a gentle retrograde pyelogram was performed, revealing a normal caliber ureter without any filling defects. There was a slight kink proximally suggesting a mild right UPJ obstruction with laxity in the proximal ureter but no real obstruction confirmed with retrograde and direct visualization,  There was moderate right hydronephrosis of the collecting system. There was a 6 mm filling defect in the right renal pelvis corresponding to the stone. A 0.038 sensor wire was then passed up  to the level of the renal pelvis and secured to the drape as a safety wire. The ureteral catheter and cystoscope were removed, leaving the safety wire in place.   The flexible ureteroscope was advanced over the wire until we encountered the ureteral stone. It appeared larger than the stone, about 71mm. I then passed a separate sensor wire, removed the ureteroscope, passed an access sheath 12/14 sheath without difficulty. Using the 200 micron holmium laser fiber, the stone was dusted completely. A 2.2 Fr zero tip basket was used to remove the fragments under visual guidance. These were sent for chemical analysis. Most of the stone was dust.  With the ureteroscope in the kidney, a gentle pyelogram was performed to delineate the calyceal system and we evaluated the calyces systematically. We encountered no further stones. The rest of the stone fragments were very tiny and these were  irrigated away gently. The calyces were re-inspected and there were no significant stone fragment residual.   We then withdrew the ureteroscope back down the ureter along with the access sheath, noting no evidence of any stones along the course of the ureter.  Prior to removing the ureteroscope, we did pass the Glidewire back up to the ureter to the renal pelvis. Once the ureteroscope was removed, the Glidewire was backloaded through the rigid cystoscope, which was then advanced down the urethra and into the bladder. We then used the Glidewire under direct vision through the rigid cystoscope and under fluoroscopic guidance and passed up a 6-French, 24 cm double-pigtail ureteral stent up ureter, making sure that the proximal and distal ends coiled within the kidney and bladder respectively. Of note, there was some laxity in the kidney and the stent was seen deviating the ureter medially. There was evidence of draining contrast through the holes of the stent. I watched and the contrast drained from the kidney.  Note that we did not  leave a tether string.  The cystoscope was then advanced back into the bladder under vision.  We were able to see the distal stent coiling nicely within the bladder.  The bladder was then emptied with irrigation solution.  The cystoscope was then removed.    The patient tolerated the procedure well and there was no complication. Patient was awoken from anesthesia and taken to the recovery room in stable condition. I was present and scrubbed for the entirety of the case.  Plan:  Patient will be discharged home.  Follow up with me in 7 to 10 days for stent removal in the office.  She elected to pursue this option as she is going to her son's graduation this weekend and did not want to have an issue with pulling the stent out early.  [facility]  Pager: [phone]

## 2020-07-27 NOTE — H&P (Signed)
Office Visit Report     07/24/2020   --------------------------------------------------------------------------------   Judy Weaver  MRN: [id number]  DOB: 02/20/1966, 55 year old Female  SSN:    PRIMARY CARE:  Cari Caraway, MD  REFERRING:    PROVIDER:  Rexene Alberts, M.D.  LOCATION:  [facility] [phone]     --------------------------------------------------------------------------------   CC/HPI: Judy Weaver is a 55 year old female seen in consultation today for right flank pain and right proximal ureteral stone.   She presented to [facility] ED on 07/24/2020 with acute right flank pain reported 10/10 associated with nausea and emesis. She was afebrile. She denies dysuria. White blood cell count was elevated 11.6. Creatinine was 1.07. She had rare bacteria on urinalysis. CT A/P demonstrated 6 mm obstructing stone at the right proximal ureter. She denies prior stone episodes.     ALLERGIES: Sulfa - Vomiting    MEDICATIONS: BUPROPION HCL PO Daily  CITALOPRAM HBR PO Daily  Crestor Daily  Flomax  LAMICTAL PO Daily  LISINOPRIL PO Daily  METFORMIN HCL PO Daily  Multivitamin  Oxycodone Hcl     GU PSH: None   NON-GU PSH: Colonoscopy C-Section     GU PMH: None   NON-GU PMH: GERD Hypercholesterolemia Hypertension    FAMILY HISTORY: 1 Daughter - Runs in Family 1 son - Runs in Family Congestive Heart Failure - Mother High Blood Pressure - Father Kidney Stones - Runs In Family   SOCIAL HISTORY: Marital Status: Married Current Smoking Status: Patient has never smoked.  Does not use smokeless tobacco. Social Drinker.  Does not use drugs. Drinks 3 caffeinated drinks per day. Has not had a blood transfusion. Patient's occupation Research officer, political party.    REVIEW OF SYSTEMS:    GU Review Female:   Patient denies frequent urination, hard to postpone urination, burning /pain with urination, get up at night to urinate, leakage of urine, stream starts and stops,  trouble starting your stream, have to strain to urinate, and being pregnant.  Gastrointestinal (Upper):   Patient denies nausea, vomiting, and indigestion/ heartburn.  Gastrointestinal (Lower):   Patient denies diarrhea and constipation.  Constitutional:   Patient denies fever, night sweats, weight loss, and fatigue.  Skin:   Patient denies skin rash/ lesion and itching.  Eyes:   Patient denies blurred vision and double vision.  Ears/ Nose/ Throat:   Patient denies sore throat and sinus problems.  Hematologic/Lymphatic:   Patient denies swollen glands and easy bruising.  Cardiovascular:   Patient denies leg swelling and chest pains.  Respiratory:   Patient denies cough and shortness of breath.  Endocrine:   Patient denies excessive thirst.  Musculoskeletal:   Patient denies back pain and joint pain.  Neurological:   Patient denies headaches and dizziness.  Psychologic:   Patient denies depression and anxiety.   VITAL SIGNS:      07/24/2020 10:10 AM  Weight 175 lb / 79.38 kg  Height 62 in / 157.48 cm  BP 127/84 mmHg  Pulse 91 /min  Temperature 98.4 F / 36.8 C  BMI 32.0 kg/m   MULTI-SYSTEM PHYSICAL EXAMINATION:    Constitutional: Well-nourished. No physical deformities. Normally developed. Good grooming.  Respiratory: No labored breathing, no use of accessory muscles.   Cardiovascular: Normal temperature, normal extremity pulses, no swelling, no varicosities.  Gastrointestinal: No mass, no tenderness, no rigidity, non obese abdomen. No CVA tenderness     Complexity of Data:  Source Of History:  Patient, Medical Record Summary  Records Review:  Previous Doctor Records, Previous Hospital Records, Previous Patient Records  Urine Test Review:   Urinalysis  X-Ray Review: C.T. Stone Protocol: Reviewed Films. Reviewed Report. Discussed With Patient.    Notes:                     CLINICAL DATA: Right flank pain.   EXAM:  CT ABDOMEN AND PELVIS WITHOUT CONTRAST   TECHNIQUE:   Multidetector CT imaging of the abdomen and pelvis was performed  following the standard protocol without IV contrast.   COMPARISON: March 28, 2007   FINDINGS:  Lower chest: Bilateral breast implants are noted.   Hepatobiliary: There is diffuse fatty infiltration of the liver  parenchyma. No focal liver abnormality is seen. No gallstones,  gallbladder wall thickening, or biliary dilatation.   Pancreas: Unremarkable. No pancreatic ductal dilatation or  surrounding inflammatory changes.   Spleen: Normal in size without focal abnormality.   Adrenals/Urinary Tract: Adrenal glands are unremarkable. Kidneys are  normal in size, without focal lesions. A 6 mm obstructing renal  stone is seen within the proximal right ureter with moderate  severity right-sided hydronephrosis and hydroureter. Moderate to  marked severity right-sided perinephric inflammatory fat stranding  is also seen. Bladder is unremarkable.   Stomach/Bowel: Stomach is within normal limits. Appendix appears  normal. No evidence of bowel wall thickening, distention, or  inflammatory changes. Noninflamed diverticula are seen within the  descending and sigmoid colon.   Vascular/Lymphatic: Aortic atherosclerosis. No enlarged abdominal or  pelvic lymph nodes.   Reproductive: Uterus and bilateral adnexa are unremarkable.   Other: No abdominal wall hernia or abnormality. No abdominopelvic  ascites.   Musculoskeletal: Degenerative changes are seen within the lumbar  spine at the level of L4-L5.   IMPRESSION:  1. 6 mm obstructing renal stone within the proximal right ureter.  2. Colonic diverticulosis.    Electronically Signed  By: Virgina Norfolk M.D.  On: 07/24/2020 03:42   PROCEDURES: None   ASSESSMENT:      ICD-10 Details  1 GU:   Ureteral calculus - N20.1   2   Flank Pain - R10.84    PLAN:           Orders Labs Urinalysis, CULTURE, URINE          Document Letter(s):  Created for Patient:  Clinical Summary         Notes:   #1. Right proximal ureteral stone: Seen on CT A/P 07/24/2020. We discussed options including medical expulsive therapy, right ureteral stent placement, definitive therapy with ESWL or ureteroscopy with laser lithotripsy. We had a long discussion about options, potential complications and timing. Given her refractory pain, she elects to proceed with right ureteral stent placement today. We will then schedule her for right ureteroscopy with laser lithotripsy and basket extraction of stone hopefully next week. We discussed risk and benefits of this procedure. Send urine for culture.   CC: Cari Caraway, MD    Signed by Rexene Alberts, M.D. on 07/24/20 at 11:23 AM (EDT)  Urology Preoperative H&P   Chief Complaint: right ureteral stone  History of Present Illness: Judy Weaver is a 55 y.o. female with a right ureteral stone here for cysto, R RPG, R URS/LL, right stent placement. She is s/p right stent placement on 5/6. Urine culture was negative. Here for definitive treatment of stones.  Past Medical History:  Diagnosis Date  . Abnormal Pap smear   . Bilateral swelling of feet   . Breech birth   .  Chicken pox   . Depression    Post Partum  . Dysmenorrhea   . Fibroid   . Group B streptococcal infection in pregnancy   . H/O wisdom tooth extraction   . High cholesterol   . History of kidney stones   . Hypertension    during pregnancy  . Pre-diabetes   . Preeclampsia   . Preterm labor   . Shortness of breath   . Sleep apnea     Past Surgical History:  Procedure Laterality Date  . ABDOMINOPLASTY    . AUGMENTATION MAMMAPLASTY Bilateral   . BREAST ENHANCEMENT SURGERY    . BREAST SURGERY    . CESAREAN SECTION    . CYSTOSCOPY W/ URETERAL STENT PLACEMENT Right 07/24/2020   Procedure: CYSTOSCOPY WITH RETROGRADE PYELOGRAM/URETERAL STENT PLACEMENT;  Surgeon: Janith Lima, MD;  Location: [facility];  Service: Urology;  Laterality: Right;  . DILATATION  & CURRETTAGE/HYSTEROSCOPY WITH RESECTOCOPE N/A 07/16/2013   Procedure: [place];  Surgeon: Ena Dawley, MD;  Location: [facility];  Service: Gynecology;  Laterality: N/A;  . HERNIA REPAIR     umbilical  . HYSTEROSCOPY N/A 07/16/2013   Procedure: HYSTEROSCOPY;  Surgeon: Ena Dawley, MD;  Location: [facility];  Service: Gynecology;  Laterality: N/A;  . LAPAROSCOPIC TUBAL LIGATION Bilateral 07/16/2013   Procedure: LAPAROSCOPIC TUBAL;  Surgeon: Ena Dawley, MD;  Location: [facility];  Service: Gynecology;  Laterality: Bilateral;  . NOVASURE ABLATION N/A 07/16/2013   Procedure: NOVASURE ABLATION WITH REMOVAL OF IUD;  Surgeon: Ena Dawley, MD;  Location: [facility];  Service: Gynecology;  Laterality: N/A;  . post partum depression    . WISDOM TOOTH EXTRACTION      Allergies:  Allergies  Allergen Reactions  . Sulfa Antibiotics Nausea And Vomiting    Family History  Problem Relation Age of Onset  . Breast cancer Maternal Aunt   . Hyperlipidemia Mother   . Heart disease Mother   . Diabetes Father   . Hypertension Father   . Hyperlipidemia Father   . Anxiety disorder Father     Social History:  reports that she has never smoked. She has never used smokeless tobacco. She reports current alcohol use of about 2.0 standard drinks of alcohol per week. She reports that she does not use drugs.  ROS: A complete review of systems was performed.  All systems are negative except for pertinent findings as noted.  Physical Exam:  Vital signs in last 24 hours:   Constitutional:  Alert and oriented, No acute distress Cardiovascular: Regular rate and rhythm Respiratory: Normal respiratory effort, Lungs clear bilaterally GI: Abdomen is soft, nontender, nondistended, no abdominal masses GU: No CVA tenderness Lymphatic: No lymphadenopathy Neurologic: Grossly intact, no focal deficits Psychiatric: Normal mood and affect  Laboratory Data:  No results for  input(s): WBC, HGB, HCT, PLT in the last 72 hours.  No results for input(s): NA, K, CL, GLUCOSE, BUN, CALCIUM, CREATININE in the last 72 hours.  Invalid input(s): CO3   No results found for this or any previous visit (from the past 24 hour(s)). Recent Results (from the past 240 hour(s))  SARS Coronavirus 2 by RT PCR (hospital order, performed in [facility] hospital lab) Nasopharyngeal Nasopharyngeal Swab     Status: None   Collection Time: 07/24/20  2:31 PM   Specimen: Nasopharyngeal Swab  Result Value Ref Range Status   SARS Coronavirus 2 NEGATIVE NEGATIVE Final    Comment: (NOTE) SARS-CoV-2 target nucleic acids are NOT DETECTED.  The SARS-CoV-2 RNA is generally detectable in upper and lower respiratory specimens during the acute phase of infection. The lowest concentration of SARS-CoV-2 viral copies this assay can detect is 250 copies / mL. A negative result does not preclude SARS-CoV-2 infection and should not be used as the sole basis for treatment or other patient management decisions.  A negative result may occur with improper specimen collection / handling, submission of specimen other than nasopharyngeal swab, presence of viral mutation(s) within the areas targeted by this assay, and inadequate number of viral copies (<250 copies / mL). A negative result must be combined with clinical observations, patient history, and epidemiological information.  Fact Sheet for Patients:   StrictlyIdeas.no  Fact Sheet for Healthcare Providers: BankingDealers.co.za  This test is not yet approved or  cleared by the [place] FDA and has been authorized for detection and/or diagnosis of SARS-CoV-2 by FDA under an Emergency Use Authorization (EUA).  This EUA will remain in effect (meaning this test can be used) for the duration of the COVID-19 declaration under Section 564(b)(1) of the Act, 21 U.S.C. section 360bbb-3(b)(1), unless the  authorization is terminated or revoked sooner.  Performed at [facility], [place] [address], [place], [place] [address]   Urine Culture     Status: None   Collection Time: 07/24/20  4:54 PM   Specimen: PATH Cytology Urine  Result Value Ref Range Status   Specimen Description   Final    URINE, RANDOM Performed at [facility] [address], [place], [place] [address]    Special Requests   Final    NONE Performed at [facility], [place] [address] [place], [place] [address]    Culture   Final    NO GROWTH Performed at [facility], [place] [address], [place], [place] [address]    Report Status 07/27/2020 FINAL  Final    Renal Function: Recent Labs    07/24/20 0204  CREATININE 1.07*   Estimated Creatinine Clearance: 60.9 mL/min (A) (by C-G formula based on SCr of 1.07 mg/dL (H)).  Radiologic Imaging: No results found.  I independently reviewed the above imaging studies.  Assessment and Plan Judy Weaver is a 55 y.o. female with right ureteral stone here for cysto, R RPG, R URS/LL, right stent placement. She is s/p right stent placement on 5/6. Urine culture was negative. Here for definitive treatment of stones.  -The risks, benefits and alternatives of cystoscopy with right ureteral stone here for cysto, R RPG, R URS/LL, right stent placement was discussed with the patient.  Risks include, but are not limited to: bleeding, urinary tract infection, ureteral injury, ureteral stricture disease, chronic pain, urinary symptoms, bladder injury, stent migration, the need for nephrostomy tube placement, MI, CVA, DVT, PE and the inherent risks with general anesthesia.  The patient voices understanding and wishes to proceed.   Matt R. Dayton Sherr MD 07/27/2020, 1:09 PM  [facility] Pager: [phone]): [phone]

## 2020-07-27 NOTE — Transfer of Care (Signed)
Immediate Anesthesia Transfer of Care Note  Patient: Judy Weaver  Procedure(s) Performed: CYSTOSCOPY/ RETROGRADE/URETEROSCOPY/HOLMIUM LASER/STENT PLACEMENT (Right )  Patient Location: PACU  Anesthesia Type:General  Level of Consciousness: awake  Airway & Oxygen Therapy: Patient Spontanous Breathing  Post-op Assessment: Report given to RN and Post -op Vital signs reviewed and stable  Post vital signs: Reviewed and stable  Last Vitals:  Vitals Value Taken Time  BP 135/75 07/27/20 1734  Temp    Pulse 103 07/27/20 1736  Resp 17 07/27/20 1736  SpO2 98 % 07/27/20 1736  Vitals shown include unvalidated device data.  Last Pain:  Vitals:   07/27/20 1405  TempSrc:   PainSc: 2       Patients Stated Pain Goal: 2 (68/37/29 0211)  Complications: No complications documented.

## 2020-07-27 NOTE — Anesthesia Preprocedure Evaluation (Signed)
Anesthesia Evaluation    Reviewed: Allergy & Precautions, Patient's Chart, lab work & pertinent test results  Airway Mallampati: I  TM Distance: >3 FB Neck ROM: Full    Dental no notable dental hx.    Pulmonary sleep apnea ,    Pulmonary exam normal        Cardiovascular hypertension, Pt. on medications Normal cardiovascular exam     Neuro/Psych PSYCHIATRIC DISORDERS Depression negative neurological ROS     GI/Hepatic negative GI ROS, Neg liver ROS,   Endo/Other  diabetes, Type 2, Oral Hypoglycemic Agents  Renal/GU negative Renal ROS     Musculoskeletal negative musculoskeletal ROS (+)   Abdominal Normal abdominal exam  (+)   Peds  Hematology negative hematology ROS (+)   Anesthesia Other Findings   Reproductive/Obstetrics                            Anesthesia Physical Anesthesia Plan  ASA: II  Anesthesia Plan: General   Post-op Pain Management:    Induction: Intravenous  PONV Risk Score and Plan: 4 or greater and Ondansetron, Dexamethasone, Midazolam and Scopolamine patch - Pre-op  Airway Management Planned: LMA  Additional Equipment: None  Intra-op Plan:   Post-operative Plan: Extubation in OR  Informed Consent: I have reviewed the patients History and Physical, chart, labs and discussed the procedure including the risks, benefits and alternatives for the proposed anesthesia with the patient or authorized representative who has indicated his/her understanding and acceptance.     Dental advisory given  Plan Discussed with: CRNA  Anesthesia Plan Comments:        Anesthesia Quick Evaluation

## 2020-07-27 NOTE — Anesthesia Procedure Notes (Signed)
Procedure Name: LMA Insertion Performed by: Milford Cage, CRNA Pre-anesthesia Checklist: Patient identified, Emergency Drugs available, Suction available and Patient being monitored Patient Re-evaluated:Patient Re-evaluated prior to induction Oxygen Delivery Method: Circle System Utilized Preoxygenation: Pre-oxygenation with 100% oxygen Induction Type: IV induction Ventilation: Mask ventilation without difficulty LMA: LMA inserted LMA Size: 4.0 Number of attempts: 1 Airway Equipment and Method: Bite block Placement Confirmation: positive ETCO2 Tube secured with: Tape Dental Injury: Teeth and Oropharynx as per pre-operative assessment  Comments: Small nick on bottom lip from contact with teeth. No active bleeding

## 2020-07-28 ENCOUNTER — Encounter (HOSPITAL_COMMUNITY): Payer: Self-pay | Admitting: Urology

## 2021-05-18 ENCOUNTER — Other Ambulatory Visit: Payer: Self-pay | Admitting: Family Medicine

## 2021-05-18 DIAGNOSIS — Z1231 Encounter for screening mammogram for malignant neoplasm of breast: Secondary | ICD-10-CM

## 2021-05-20 ENCOUNTER — Ambulatory Visit
Admission: RE | Admit: 2021-05-20 | Discharge: 2021-05-20 | Disposition: A | Discharge status: Home / Self Care | Payer: 59 | Source: Ambulatory Visit | Attending: Family Medicine | Admitting: Family Medicine

## 2021-05-20 DIAGNOSIS — Z1231 Encounter for screening mammogram for malignant neoplasm of breast: Secondary | ICD-10-CM

## 2021-10-27 ENCOUNTER — Encounter (INDEPENDENT_AMBULATORY_CARE_PROVIDER_SITE_OTHER): Payer: Self-pay

## 2022-06-29 ENCOUNTER — Other Ambulatory Visit: Payer: Self-pay | Admitting: Family Medicine

## 2022-06-29 DIAGNOSIS — Z0000 Encounter for general adult medical examination without abnormal findings: Secondary | ICD-10-CM

## 2022-08-09 ENCOUNTER — Ambulatory Visit
Admission: RE | Admit: 2022-08-09 | Discharge: 2022-08-09 | Disposition: A | Discharge status: Home / Self Care | Payer: 59 | Source: Ambulatory Visit | Attending: Family Medicine | Admitting: Family Medicine

## 2022-08-09 DIAGNOSIS — Z0000 Encounter for general adult medical examination without abnormal findings: Secondary | ICD-10-CM

## 2022-08-12 ENCOUNTER — Other Ambulatory Visit: Payer: Self-pay | Admitting: Family Medicine

## 2022-08-12 DIAGNOSIS — R928 Other abnormal and inconclusive findings on diagnostic imaging of breast: Secondary | ICD-10-CM

## 2022-08-24 ENCOUNTER — Ambulatory Visit
Admission: RE | Admit: 2022-08-24 | Discharge: 2022-08-24 | Disposition: A | Discharge status: Home / Self Care | Payer: 59 | Source: Ambulatory Visit | Attending: Family Medicine | Admitting: Family Medicine

## 2022-08-24 DIAGNOSIS — R928 Other abnormal and inconclusive findings on diagnostic imaging of breast: Secondary | ICD-10-CM

## 2022-08-31 ENCOUNTER — Other Ambulatory Visit: Payer: 59

## 2023-09-07 ENCOUNTER — Other Ambulatory Visit: Payer: Self-pay | Admitting: Family Medicine

## 2023-09-07 DIAGNOSIS — Z1231 Encounter for screening mammogram for malignant neoplasm of breast: Secondary | ICD-10-CM

## 2023-09-27 ENCOUNTER — Ambulatory Visit

## 2023-10-11 ENCOUNTER — Ambulatory Visit
Admission: RE | Admit: 2023-10-11 | Discharge: 2023-10-11 | Disposition: A | Discharge status: Home / Self Care | Source: Ambulatory Visit | Attending: Family Medicine | Admitting: Family Medicine

## 2023-10-11 DIAGNOSIS — Z1231 Encounter for screening mammogram for malignant neoplasm of breast: Secondary | ICD-10-CM

## 2023-10-31 DIAGNOSIS — Z8639 Personal history of other endocrine, nutritional and metabolic disease: Secondary | ICD-10-CM | POA: Diagnosis not present

## 2023-10-31 DIAGNOSIS — R7303 Prediabetes: Secondary | ICD-10-CM | POA: Diagnosis not present

## 2023-10-31 DIAGNOSIS — E559 Vitamin D deficiency, unspecified: Secondary | ICD-10-CM | POA: Diagnosis not present

## 2023-11-10 DIAGNOSIS — F4323 Adjustment disorder with mixed anxiety and depressed mood: Secondary | ICD-10-CM | POA: Diagnosis not present

## 2023-12-01 DIAGNOSIS — F4323 Adjustment disorder with mixed anxiety and depressed mood: Secondary | ICD-10-CM | POA: Diagnosis not present

## 2023-12-05 DIAGNOSIS — E559 Vitamin D deficiency, unspecified: Secondary | ICD-10-CM | POA: Diagnosis not present

## 2023-12-05 DIAGNOSIS — Z8639 Personal history of other endocrine, nutritional and metabolic disease: Secondary | ICD-10-CM | POA: Diagnosis not present

## 2023-12-05 DIAGNOSIS — R7303 Prediabetes: Secondary | ICD-10-CM | POA: Diagnosis not present

## 2023-12-07 DIAGNOSIS — F4323 Adjustment disorder with mixed anxiety and depressed mood: Secondary | ICD-10-CM | POA: Diagnosis not present

## 2024-01-01 DIAGNOSIS — E782 Mixed hyperlipidemia: Secondary | ICD-10-CM | POA: Diagnosis not present

## 2024-01-01 DIAGNOSIS — F3160 Bipolar disorder, current episode mixed, unspecified: Secondary | ICD-10-CM | POA: Diagnosis not present

## 2024-01-01 DIAGNOSIS — Z23 Encounter for immunization: Secondary | ICD-10-CM | POA: Diagnosis not present

## 2024-01-01 DIAGNOSIS — R7303 Prediabetes: Secondary | ICD-10-CM | POA: Diagnosis not present

## 2024-01-01 DIAGNOSIS — Z8639 Personal history of other endocrine, nutritional and metabolic disease: Secondary | ICD-10-CM | POA: Diagnosis not present

## 2024-01-02 DIAGNOSIS — F4323 Adjustment disorder with mixed anxiety and depressed mood: Secondary | ICD-10-CM | POA: Diagnosis not present

## 2024-01-10 DIAGNOSIS — R7303 Prediabetes: Secondary | ICD-10-CM | POA: Diagnosis not present

## 2024-01-10 DIAGNOSIS — Z8639 Personal history of other endocrine, nutritional and metabolic disease: Secondary | ICD-10-CM | POA: Diagnosis not present

## 2024-01-10 DIAGNOSIS — E559 Vitamin D deficiency, unspecified: Secondary | ICD-10-CM | POA: Diagnosis not present

## 2024-01-10 DIAGNOSIS — R110 Nausea: Secondary | ICD-10-CM | POA: Diagnosis not present

## 2024-01-16 DIAGNOSIS — F4323 Adjustment disorder with mixed anxiety and depressed mood: Secondary | ICD-10-CM | POA: Diagnosis not present

## 2024-01-17 IMAGING — MG DIGITAL SCREENING BREAST BILAT IMPLANT W/ TOMO W/ CAD
8 of 12 series · 8 of 28 positions shown · non-contrast
Comparison: Previous exam(s).

CLINICAL DATA: Screening.

EXAM:
DIGITAL SCREENING BILATERAL MAMMOGRAM WITH IMPLANTS, CAD AND
TOMOSYNTHESIS
TECHNIQUE: Bilateral screening digital craniocaudal and mediolateral oblique
mammograms were obtained. Bilateral screening digital breast
tomosynthesis was performed. The images were evaluated with
computer-aided detection. Standard and/or implant displaced views
were performed.

[R MLO]
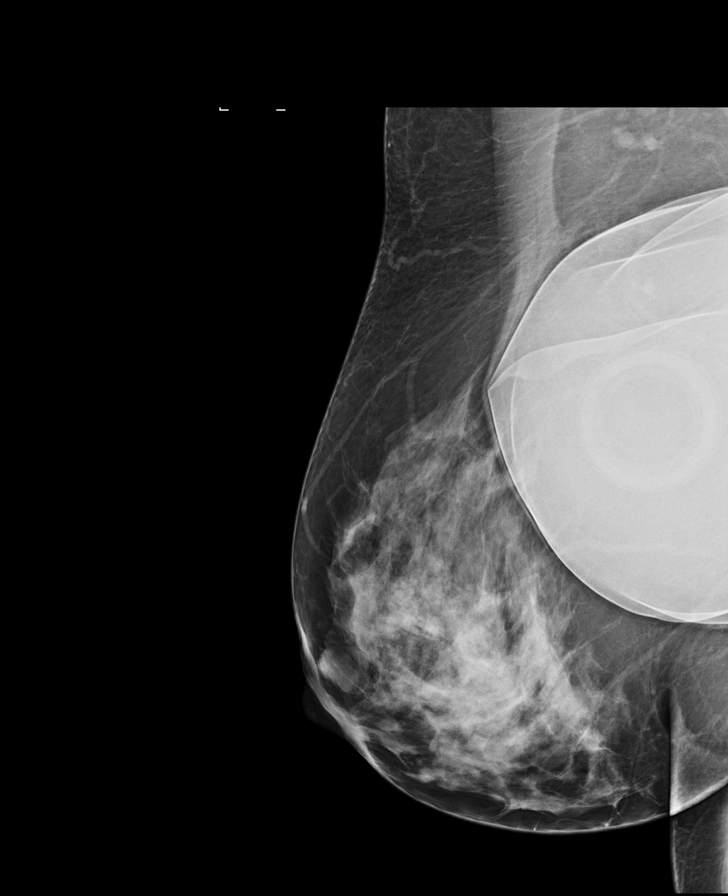

[L MLO]
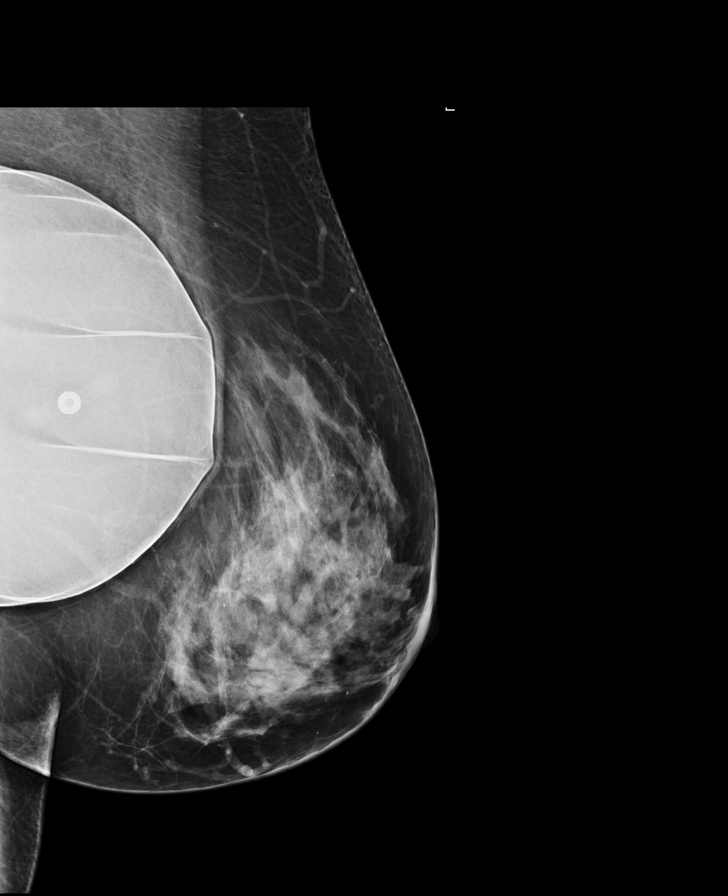

[L CC]
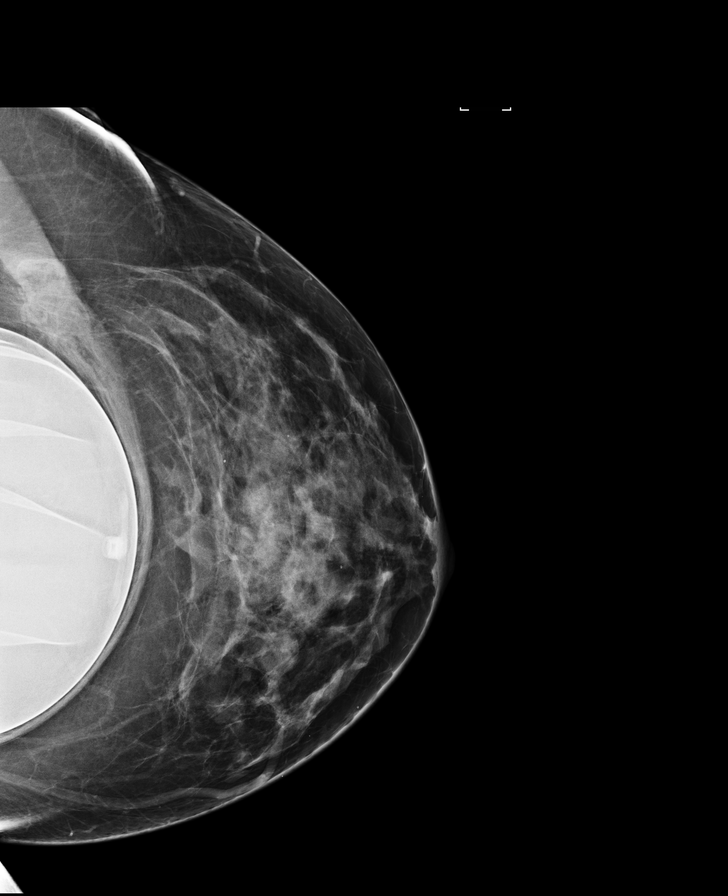

[R CC]
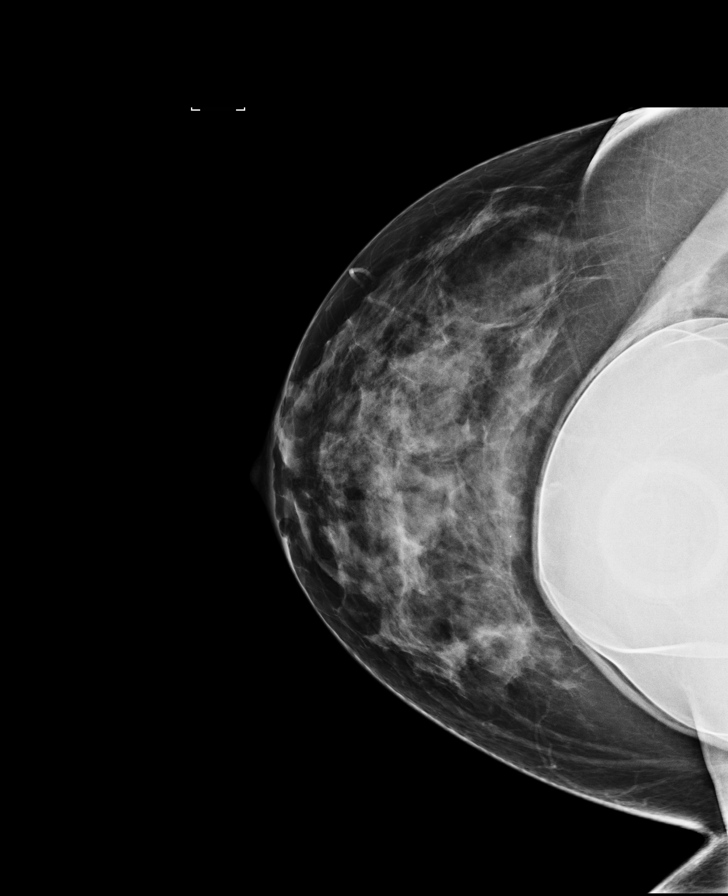

[R MLO synth-2D]
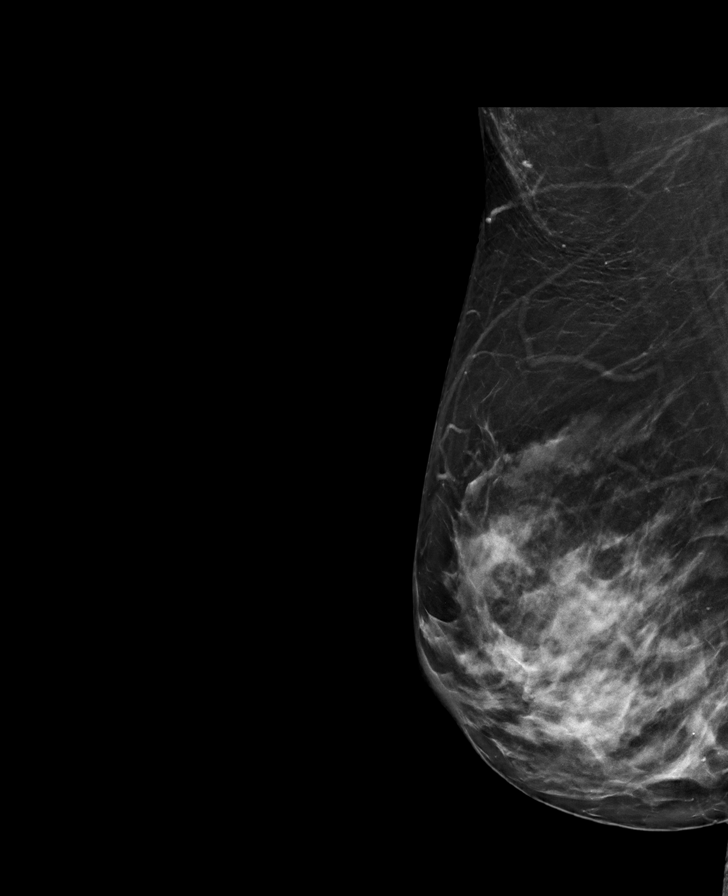

[L CC synth-2D]
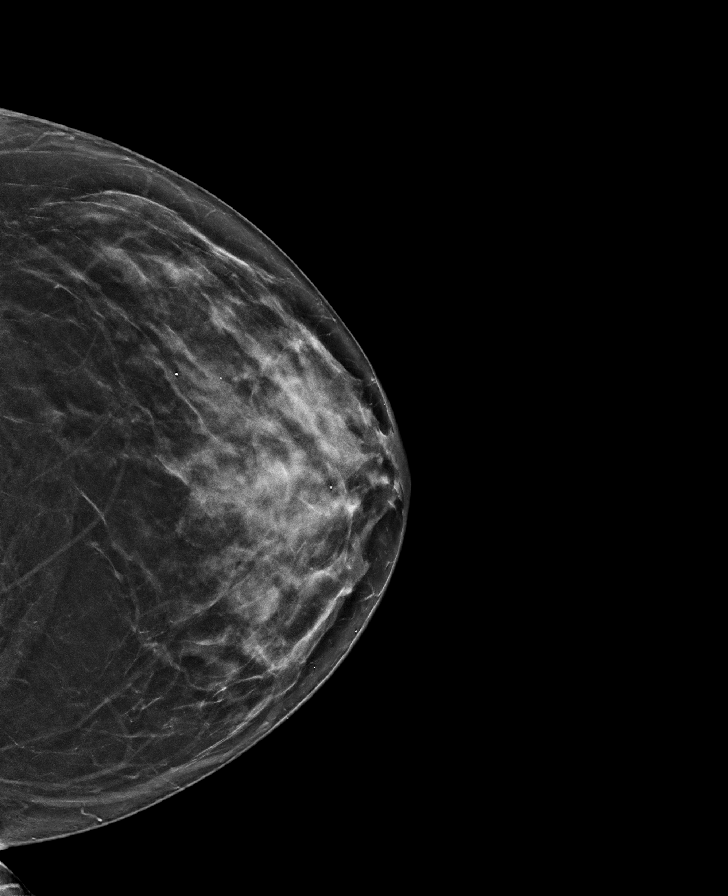

[L MLO synth-2D]
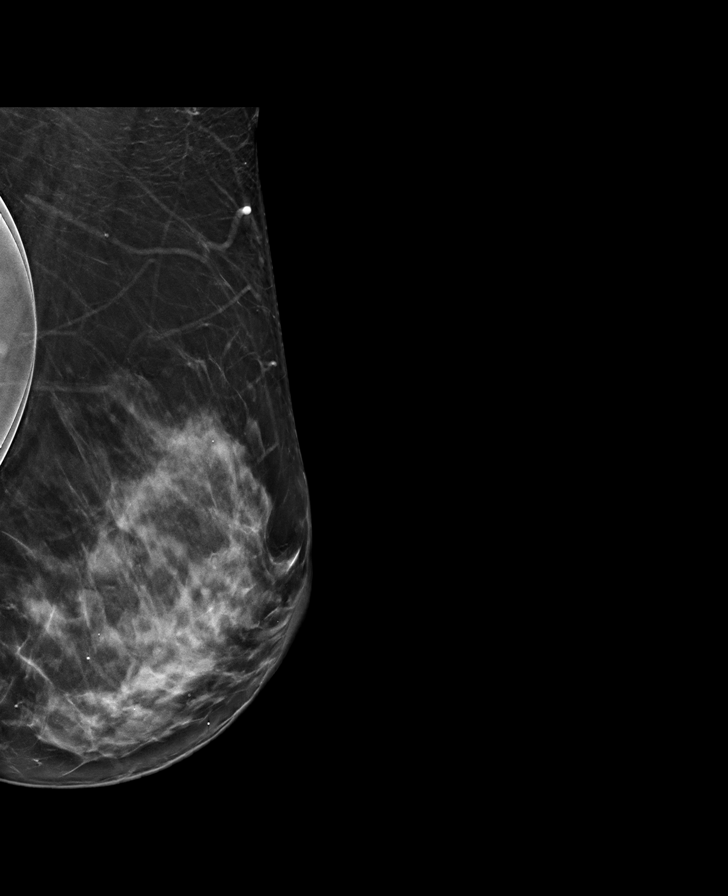

[R CC synth-2D]
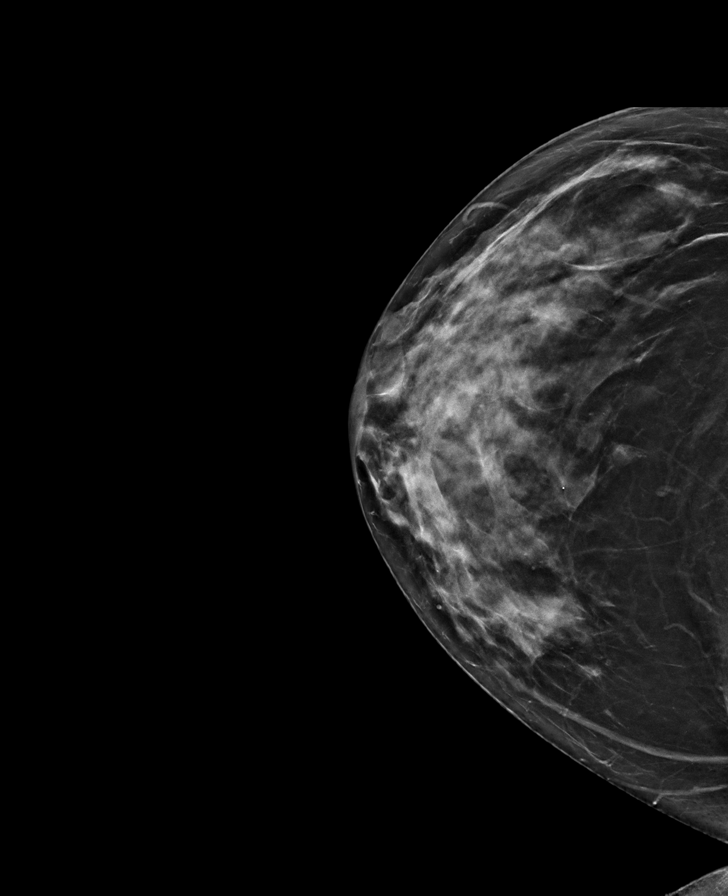

[8 of 28 positions shown; findings below may reference images not displayed]

ACR Breast Density Category c: The breast tissue is heterogeneously
dense, which may obscure small masses.
FINDINGS: The patient has retropectoral implants. There are no findings
suspicious for malignancy.
IMPRESSION: No mammographic evidence of malignancy. A result letter of this
screening mammogram will be mailed directly to the patient.

RECOMMENDATION:
Screening mammogram in one year. (Code:LT-E-7TH)

BI-RADS CATEGORY  1:  Negative.

## 2024-01-30 DIAGNOSIS — F411 Generalized anxiety disorder: Secondary | ICD-10-CM | POA: Diagnosis not present

## 2024-02-08 DIAGNOSIS — M19041 Primary osteoarthritis, right hand: Secondary | ICD-10-CM | POA: Diagnosis not present

## 2024-02-08 DIAGNOSIS — G252 Other specified forms of tremor: Secondary | ICD-10-CM | POA: Diagnosis not present

## 2024-02-08 DIAGNOSIS — D649 Anemia, unspecified: Secondary | ICD-10-CM | POA: Diagnosis not present

## 2024-02-08 DIAGNOSIS — F3160 Bipolar disorder, current episode mixed, unspecified: Secondary | ICD-10-CM | POA: Diagnosis not present

## 2024-02-08 DIAGNOSIS — M19042 Primary osteoarthritis, left hand: Secondary | ICD-10-CM | POA: Diagnosis not present

## 2024-02-13 DIAGNOSIS — F411 Generalized anxiety disorder: Secondary | ICD-10-CM | POA: Diagnosis not present

## 2024-03-07 DIAGNOSIS — R110 Nausea: Secondary | ICD-10-CM | POA: Diagnosis not present

## 2024-03-07 DIAGNOSIS — Z8639 Personal history of other endocrine, nutritional and metabolic disease: Secondary | ICD-10-CM | POA: Diagnosis not present

## 2024-03-07 DIAGNOSIS — E559 Vitamin D deficiency, unspecified: Secondary | ICD-10-CM | POA: Diagnosis not present

## 2024-03-07 DIAGNOSIS — R7303 Prediabetes: Secondary | ICD-10-CM | POA: Diagnosis not present
